# Patient Record
Sex: Female | Born: 1970
Health system: Southern US, Community
[De-identification: ages and names within clinical notes are randomized; demographics above are authoritative.]

## PROBLEM LIST (undated history)

## (undated) DIAGNOSIS — F419 Anxiety disorder, unspecified: Secondary | ICD-10-CM

## (undated) DIAGNOSIS — K635 Polyp of colon: Secondary | ICD-10-CM

## (undated) DIAGNOSIS — E119 Type 2 diabetes mellitus without complications: Secondary | ICD-10-CM

## (undated) DIAGNOSIS — I10 Essential (primary) hypertension: Secondary | ICD-10-CM

## (undated) DIAGNOSIS — N920 Excessive and frequent menstruation with regular cycle: Secondary | ICD-10-CM

## (undated) DIAGNOSIS — E669 Obesity, unspecified: Secondary | ICD-10-CM

## (undated) DIAGNOSIS — K219 Gastro-esophageal reflux disease without esophagitis: Secondary | ICD-10-CM

## (undated) DIAGNOSIS — E785 Hyperlipidemia, unspecified: Secondary | ICD-10-CM

## (undated) DIAGNOSIS — R609 Edema, unspecified: Secondary | ICD-10-CM

## (undated) HISTORY — PX: UPPER GI ENDOSCOPY: SHX6162

## (undated) HISTORY — PX: TOTAL HIP ARTHROPLASTY: SHX124

## (undated) HISTORY — DX: Hyperlipidemia, unspecified: E78.5

## (undated) HISTORY — PX: OTHER SURGICAL HISTORY: SHX169

## (undated) HISTORY — DX: Edema, unspecified: R60.9

## (undated) HISTORY — DX: Essential (primary) hypertension: I10

## (undated) HISTORY — PX: JOINT REPLACEMENT: SHX530

## (undated) HISTORY — DX: Polyp of colon: K63.5

## (undated) HISTORY — DX: Obesity, unspecified: E66.9

## (undated) HISTORY — DX: Type 2 diabetes mellitus without complications: E11.9

## (undated) HISTORY — DX: Anxiety disorder, unspecified: F41.9

## (undated) HISTORY — DX: Gastro-esophageal reflux disease without esophagitis: K21.9

## (undated) HISTORY — DX: Excessive and frequent menstruation with regular cycle: N92.0

---

## 1998-12-29 ENCOUNTER — Other Ambulatory Visit: Admission: RE | Admit: 1998-12-29 | Discharge: 1998-12-29 | Payer: Self-pay | Admitting: Obstetrics and Gynecology

## 1999-01-12 ENCOUNTER — Other Ambulatory Visit: Admission: RE | Admit: 1999-01-12 | Discharge: 1999-01-12 | Payer: Self-pay | Admitting: *Deleted

## 1999-04-18 ENCOUNTER — Other Ambulatory Visit: Admission: RE | Admit: 1999-04-18 | Discharge: 1999-04-18 | Payer: Self-pay | Admitting: *Deleted

## 1999-07-28 ENCOUNTER — Other Ambulatory Visit: Admission: RE | Admit: 1999-07-28 | Discharge: 1999-07-28 | Payer: Self-pay | Admitting: Obstetrics and Gynecology

## 2000-02-21 ENCOUNTER — Other Ambulatory Visit: Admission: RE | Admit: 2000-02-21 | Discharge: 2000-02-21 | Payer: Self-pay | Admitting: Obstetrics and Gynecology

## 2002-11-19 HISTORY — PX: CHOLECYSTECTOMY: SHX55

## 2010-09-25 ENCOUNTER — Ambulatory Visit: Payer: Self-pay | Admitting: Unknown Physician Specialty

## 2010-10-05 ENCOUNTER — Inpatient Hospital Stay: Payer: Self-pay | Admitting: Unknown Physician Specialty

## 2010-10-09 LAB — PATHOLOGY REPORT

## 2011-12-07 HISTORY — PX: OTHER SURGICAL HISTORY: SHX169

## 2012-01-03 ENCOUNTER — Ambulatory Visit: Payer: Self-pay

## 2013-12-31 ENCOUNTER — Ambulatory Visit: Payer: Self-pay | Admitting: Orthopedic Surgery

## 2014-02-10 ENCOUNTER — Ambulatory Visit: Payer: Self-pay | Admitting: Orthopedic Surgery

## 2014-02-10 LAB — CBC
HCT: 43.7 % (ref 35.0–47.0)
HGB: 14.7 g/dL (ref 12.0–16.0)
MCH: 31.3 pg (ref 26.0–34.0)
MCHC: 33.6 g/dL (ref 32.0–36.0)
MCV: 93 fL (ref 80–100)
PLATELETS: 335 10*3/uL (ref 150–440)
RBC: 4.69 10*6/uL (ref 3.80–5.20)
RDW: 13.3 % (ref 11.5–14.5)
WBC: 8.4 10*3/uL (ref 3.6–11.0)

## 2014-02-10 LAB — BASIC METABOLIC PANEL
Anion Gap: 4 — ABNORMAL LOW (ref 7–16)
BUN: 16 mg/dL (ref 7–18)
CO2: 28 mmol/L (ref 21–32)
CREATININE: 0.83 mg/dL (ref 0.60–1.30)
Calcium, Total: 8.3 mg/dL — ABNORMAL LOW (ref 8.5–10.1)
Chloride: 106 mmol/L (ref 98–107)
EGFR (Non-African Amer.): >60
Glucose: 121 mg/dL — ABNORMAL HIGH (ref 65–99)
OSMOLALITY: 278 (ref 275–301)
Potassium: 3.6 mmol/L (ref 3.5–5.1)
Sodium: 138 mmol/L (ref 136–145)

## 2014-02-10 LAB — PROTIME-INR
INR: 1
Prothrombin Time: 13.1 secs (ref 11.5–14.7)

## 2014-02-10 LAB — URINALYSIS, COMPLETE
Bacteria: NONE SEEN
Bilirubin,UR: NEGATIVE
Glucose,UR: NEGATIVE mg/dL (ref 0–75)
Ketone: NEGATIVE
NITRITE: NEGATIVE
Ph: 6 (ref 4.5–8.0)
Protein: NEGATIVE
Specific Gravity: 1.02 (ref 1.003–1.030)
WBC UR: 2 /HPF (ref 0–5)

## 2014-02-10 LAB — APTT: ACTIVATED PTT: 30.7 s (ref 23.6–35.9)

## 2014-02-10 LAB — MRSA PCR SCREENING

## 2014-02-10 LAB — SEDIMENTATION RATE: Erythrocyte Sed Rate: 9 mm/hr (ref 0–20)

## 2014-02-23 ENCOUNTER — Inpatient Hospital Stay: Payer: Self-pay | Admitting: Orthopedic Surgery

## 2014-02-24 LAB — BASIC METABOLIC PANEL
ANION GAP: 3 — AB (ref 7–16)
BUN: 8 mg/dL (ref 7–18)
CREATININE: 0.78 mg/dL (ref 0.60–1.30)
Calcium, Total: 8 mg/dL — ABNORMAL LOW (ref 8.5–10.1)
Chloride: 106 mmol/L (ref 98–107)
Co2: 27 mmol/L (ref 21–32)
Glucose: 152 mg/dL — ABNORMAL HIGH (ref 65–99)
OSMOLALITY: 273 (ref 275–301)
Potassium: 3.8 mmol/L (ref 3.5–5.1)
Sodium: 136 mmol/L (ref 136–145)

## 2014-02-24 LAB — HEMOGLOBIN: HGB: 13.5 g/dL (ref 12.0–16.0)

## 2014-02-24 LAB — PLATELET COUNT: Platelet: 288 10*3/uL (ref 150–440)

## 2014-02-25 LAB — PATHOLOGY REPORT

## 2014-07-18 ENCOUNTER — Emergency Department: Payer: Self-pay | Admitting: Internal Medicine

## 2014-07-18 LAB — URINALYSIS, COMPLETE
Bacteria: NONE SEEN
Bilirubin,UR: NEGATIVE
Glucose,UR: NEGATIVE mg/dL (ref 0–75)
Ketone: NEGATIVE
Leukocyte Esterase: NEGATIVE
Nitrite: NEGATIVE
Ph: 5 (ref 4.5–8.0)
Protein: NEGATIVE
RBC,UR: 3 /HPF (ref 0–5)
SPECIFIC GRAVITY: 1.02 (ref 1.003–1.030)
Squamous Epithelial: 3
WBC UR: 6 /HPF (ref 0–5)

## 2014-07-18 LAB — CBC
HCT: 45.9 % (ref 35.0–47.0)
HGB: 15.1 g/dL (ref 12.0–16.0)
MCH: 30.2 pg (ref 26.0–34.0)
MCHC: 33 g/dL (ref 32.0–36.0)
MCV: 92 fL (ref 80–100)
Platelet: 291 10*3/uL (ref 150–440)
RBC: 5 10*6/uL (ref 3.80–5.20)
RDW: 13.3 % (ref 11.5–14.5)
WBC: 12.2 10*3/uL — ABNORMAL HIGH (ref 3.6–11.0)

## 2014-07-18 LAB — BASIC METABOLIC PANEL
Anion Gap: 9 (ref 7–16)
BUN: 17 mg/dL (ref 7–18)
CHLORIDE: 104 mmol/L (ref 98–107)
CO2: 26 mmol/L (ref 21–32)
CREATININE: 0.88 mg/dL (ref 0.60–1.30)
Calcium, Total: 8.7 mg/dL (ref 8.5–10.1)
EGFR (Non-African Amer.): >60
Glucose: 177 mg/dL — ABNORMAL HIGH (ref 65–99)
Osmolality: 283 (ref 275–301)
Potassium: 3.9 mmol/L (ref 3.5–5.1)
Sodium: 139 mmol/L (ref 136–145)

## 2014-07-18 LAB — PROTIME-INR
INR: 0.9
Prothrombin Time: 12.3 secs (ref 11.5–14.7)

## 2014-07-18 LAB — TSH: THYROID STIMULATING HORM: 1.9 u[IU]/mL

## 2014-07-18 LAB — TROPONIN I
Troponin-I: <0.02 ng/mL
Troponin-I: <0.02 ng/mL

## 2014-07-18 LAB — D-DIMER(ARMC): D-Dimer: 661 ng/ml

## 2014-07-18 LAB — PREGNANCY, URINE: PREGNANCY TEST, URINE: NEGATIVE m[IU]/mL

## 2014-07-18 LAB — PRO B NATRIURETIC PEPTIDE: B-TYPE NATIURETIC PEPTID: 25 pg/mL (ref 0–125)

## 2015-03-12 NOTE — Op Note (Signed)
PATIENT NAME:  Jade Medina, Jade Medina MR#:  [id number] DATE OF BIRTH:  01-26-1971  DATE OF PROCEDURE:  02/23/2014  PREOPERATIVE DIAGNOSIS: Left hip avascular necrosis.   POSTOPERATIVE DIAGNOSIS:  Left hip avascular necrosis.   PROCEDURE: Left total hip replacement, direct anterior approach.   ANESTHESIA: Spinal.   SURGEON: Hessie Knows, M.D.   ASSISTANT:  Rachelle Hora, PA-C.   DESCRIPTION OF PROCEDURE: The patient was brought to the Operating Room and after adequate spinal anesthesia was obtained, the patient was placed on the operative table with the left foot in the Medacta attachment, right foot on a well-padded table. The C-arm was brought in and good visualization of the hip could be obtained. After prepping and draping in the usual sterile fashion, appropriate patient identification and timeout procedures were completed. A direct anterior approach was made to the hip with the incision centered over the greater trochanter and tensor fascia lata muscle. The incision was carried down through the skin and subcutaneous tissue. The tensor muscle was incised and retracted laterally. The deep retractor was placed and the deep fascia incised. Lateral femoral circumflex vessels were quite small and cauterized. The anterior capsule was then exposed and a flap created with the anterior capsule for subsequent retraction. Retractors were placed around the femoral neck and with slight external rotation and traction the femoral neck cut was carried out. Corkscrew was used to remove the head. It was noted to have severe degenerative change with the cartilage peeling off the head with collapsed bone consistent with avascular necrosis. The acetabulum was reamed to 54 mm, at which point there was good bleeding bone. A 54 mm trial fit well and the 54 mm VersaFit cup DM was impacted into place. The leg was externally rotated. Pubofemoral and ischiofemoral ligament release was required to allow for adequate mobilization of  the femur and allow for adequate external rotation. With the leg in extension and external rotation, sequential broaching was carried out to a size 1. This gave good fill to the canal on fluoroscopic views. Trials were placed and the final components chosen. The #1 Amis stem was impacted down the canal with an S28 head and a 54 VersaFit cup DM liner assembled after the canal was placed. The femoral head components were attached and impacted. The hip was then reduced and the length appeared to be appropriate. There was good stability to 90 degrees external rotation test. The hip was thoroughly irrigated and closed with a heavy quill suture for the deep fascia, a subcutaneous drain, 2-0 quill subcutaneously and skin staples. The hip was infiltrated with 30 mL of 0.25% Sensorcaine with epinephrine prior to wound closure to aid in postoperative analgesia. Sterile dressings of Xeroform, 4 x 4's, ABD and tape applied. The patient was sent to the recovery room in stable condition.   ESTIMATED BLOOD LOSS: 350.   COMPLICATIONS: None.   SPECIMENS: Femoral head.   IMPLANTS: Medacta Amis stem size 1 standard with a 54 mm VersaFit cup DM liner and S28 mm head.   ____________________________ Laurene Footman, MD mjm:cs D: 02/23/2014 17:15:13 ET T: 02/23/2014 18:25:28 ET JOB#: [id number]  cc: Laurene Footman, MD, <Dictator> Laurene Footman MD ELECTRONICALLY SIGNED 02/23/2014 23:01

## 2015-03-12 NOTE — Discharge Summary (Signed)
PATIENT NAME:  Jade Medina, Jade Medina MR#:  [id number] DATE OF BIRTH:  06-Sep-1971  DATE OF ADMISSION:  02/23/2014 DATE OF DISCHARGE:  02/26/2014  ADMITTING DIAGNOSIS:  Left hip avascular necrosis.  DISCHARGE DIAGNOSIS:  Left hip avascular necrosis.   OPERATION:  On 02/23/2014, the patient had a left total hip replacement using an anterior approach.   ANESTHESIA:  Spinal.   SURGEON:  Hessie Knows, M.D.   ASSISTANT:  Rachelle Hora, PA-C.   ESTIMATED BLOOD LOSS:  350 mL.   COMPLICATIONS:  None.   IMPLANTS USED:  A Medacta AMIS stem size one standard with a 54 mm Versafit cup DM liner and an S 28 mm head.  The patient was stabilized, brought to the recovery room, and then brought down to the orthopedic floor.   HISTORY OF PRESENT ILLNESS:  The patient is a 44 year old female who presented for an upcoming total hip replacement on the left.  The patient has had avascular necrosis of both hips.  The patient has had a successful right total hip replacement.  The patient has progressive worsening pain over the last year and has had difficulty with walking and activities of daily living.   PHYSICAL EXAMINATION: GENERAL:  Female in no apparent distress with an antalgic gait on the left and a hip lurch that is notable.  LUNGS:  Clear to auscultation.  HEART:  Regular rate and rhythm.  MUSCULOSKELETAL:  In regard to the left hip, the patient has 20 degrees of internal rotation with 30 degrees external rotation and it is quite painful.  There is no hip flexion noted.  The patient has sensations intact.   HOSPITAL COURSE:  After initial admission on 02/23/2014, the patient was brought back to the orthopedic floor.  The patient had a hemoglobin on postoperative day one of 13.5, which remained stable there.  The patient did work with physical therapy, initially bed to chair and progressed up to ambulating 220 feet around the nurse's station.  The patient did the stairs well.  The patient was ready to go home  with home health physical therapy on 02/26/2014.   CONDITION AT DISCHARGE:  Stable.   DISPOSITION:  The patient was sent home with home health physical therapy.   DISCHARGE INSTRUCTIONS: 1.  The patient will follow up with [facility]opedics in about 10 to 14 days for staple removal.  The patient will do weight bear as tolerated on the affected leg.  The patient will raise her leg with 1 to 2 pillows.  The patient will use anterior hip precautions.  The patient will use knee-high TED hose on both legs, removed at bedtime, replace in the morning.  The patient will be encouraged to do cough and deep breathing.  2.  The patient's diet is regular.  3.  The patient is to keep her dressing clean and not to get it wet or dirty.  4.  The patient will work with physical therapy doing gait training, range of motion activities and strengthening at home.  The patient will call the clinic if there is any bright red bleeding, calf pain, bowel or bladder difficulty or any fever greater than 101.5.   DISCHARGE MEDICATIONS:  Resume home medications and then to add Lovenox 40 mg subQ once a day for 14 days and discontinue and begin 81 mg aspirin for about a month as well as oxycodone 5 mg 1 tablet every four hours as needed for severe pain.    ____________________________ Lenna Sciara. Reche Dixon, [place]  jtm:ea D: 02/26/2014 06:14:12 ET T: 02/26/2014 06:37:39 ET JOB#: [id number]  cc: J. Reche Dixon, [place], <Dictator> J Thurza Kwiecinski [facility] PA ELECTRONICALLY SIGNED 03/03/2014 6:38

## 2015-03-12 NOTE — Op Note (Signed)
PATIENT NAME:  Jade Medina, Jade Medina MR#:  [id number] DATE OF BIRTH:  November 05, 1971  DATE OF PROCEDURE:  12/31/2013  PREOPERATIVE DIAGNOSIS: Bilateral carpal tunnel syndrome.   POSTOPERATIVE DIAGNOSIS: Bilateral carpal tunnel syndrome.   PROCEDURE: Bilateral carpal tunnel releases.   ANESTHESIA: General.   SURGEON: Laurene Footman, M.D.   DESCRIPTION OF PROCEDURE: The patient was brought to the operating room and after adequate anesthesia was obtained, both arms were prepped and draped in the usual sterile fashion with a tourniquet applied to the upper arms. After patient identification and timeout procedures were completed, the right arm was addressed first. The tourniquet was raised. An approximately 1 inch incision was made in line with the ring metacarpal going from the distal wrist flexion crease distally. The skin and subcutaneous tissue were divided. The transverse carpal ligament identified and incised distally. A vascular hemostat was placed underneath it to protect the underlying structures, and release was carried out proximally. In the proximal portion of the canal, there was approximately 1 cm area of compression on the median nerve. After release, there was good vascular blush. There were no masses within the canal. Mild flexor tenosynovitis. The wound was irrigated and 10 mL of 0.5% Sensorcaine infiltrated. The wound was closed with simple interrupted 4-0 nylon skin sutures. Xeroform, 4 x 4's, Webril and Ace wrap applied. The tourniquet was let down. Going to the left arm, identical procedure carried out, again with significant compression in the proximal portion as well as some hourglass constriction present about 0.5 cm to 1 cm in length. Again, good vascular blush and mild flexor tenosynovitis. After release, the this hand was also infiltrated with 0.5% Sensorcaine 10 mL, closed with simple interrupted 4-0 nylon. Xeroform, 4 x 4's, Webril and Ace wrap applied. Tourniquet time on the left was 9  minutes, on the right 10 minutes.   COMPLICATIONS: None.   SPECIMEN: None.   CONDITION: To recovery room stable.   ____________________________ Laurene Footman, MD mjm:gb D: 12/31/2013 19:54:20 ET T: 01/01/2014 00:26:49 ET JOB#: [id number]  cc: Laurene Footman, MD, <Dictator> Laurene Footman MD ELECTRONICALLY SIGNED 01/01/2014 12:45

## 2015-12-05 LAB — HM MAMMOGRAPHY

## 2016-08-19 HISTORY — PX: LAPAROSCOPIC GASTRIC BAND REMOVAL WITH LAPAROSCOPIC GASTRIC SLEEVE RESECTION: SHX6498

## 2016-09-06 DIAGNOSIS — Z9884 Bariatric surgery status: Secondary | ICD-10-CM | POA: Insufficient documentation

## 2016-12-11 LAB — HM PAP SMEAR: HM Pap smear: NEGATIVE

## 2016-12-24 DIAGNOSIS — S8002XA Contusion of left knee, initial encounter: Secondary | ICD-10-CM | POA: Insufficient documentation

## 2017-02-06 ENCOUNTER — Ambulatory Visit (INDEPENDENT_AMBULATORY_CARE_PROVIDER_SITE_OTHER): Payer: 59 | Admitting: Obstetrics and Gynecology

## 2017-02-06 ENCOUNTER — Encounter: Payer: Self-pay | Admitting: Obstetrics and Gynecology

## 2017-02-06 VITALS — BP 110/70 | HR 66 | Ht 63.0 in | Wt 198.0 lb

## 2017-02-06 DIAGNOSIS — Z30430 Encounter for insertion of intrauterine contraceptive device: Secondary | ICD-10-CM | POA: Diagnosis not present

## 2017-02-06 MED ORDER — LEVONORGESTREL 20 MCG/24HR IU IUD: 1.0000 | INTRAUTERINE_SYSTEM | Freq: Once | INTRAUTERINE | 0 refills | Status: AC

## 2017-02-06 NOTE — Progress Notes (Signed)
    IUD PROCEDURE NOTE:  Jade Medina is a 46 y.o. No obstetric history on file. here for Mirena  IUD insertion. No GYN concerns.  Last pap smear was normal. She would like IUD for cycle control--menorrhagia. Had Mirena in past with amenorrhea. S/P recent gastric surg with wt loss. Husband is s/p vasectomy.  BP 110/70   Pulse 66   Ht 5\' 3"  (1.6 m)   Wt 198 lb (89.8 kg)   LMP 01/31/2017   BMI 35.07 kg/m   IUD Insertion Procedure Note Patient identified, informed consent performed, consent signed.   Discussed risks of irregular bleeding, cramping, infection, malpositioning or misplacement of the IUD outside the uterus which may require further procedure such as laparoscopy, risk of failure <1%. Time out was performed.   A bimanual exam showed the uterus to be anteverted.  Speculum placed in the vagina.  Cervix visualized.  Cleaned with Betadine x 2.  Grasped anteriorly with a single tooth tenaculum.  Uterus sounded to 9.0 cm.   IUD placed per manufacturer's recommendations.  Strings trimmed to 3 cm. Tenaculum was removed, good hemostasis noted.  Patient tolerated procedure well.   ASSESSMENT: IUD insertion Mirena  LOT [place], exp Sep 2020  Plan:  Patient was given post-procedure instructions.  She was advised to have backup contraception for one week.   Call if you are having increasing pain, cramps or bleeding or if you have a fever greater than 100.4 degrees F., shaking chills, nausea or vomiting. Patient was also asked to check IUD strings periodically and follow up in 4 weeks for IUD check.  Return in about 4 weeks (around 03/06/2017) for IUD string chk.  Alicia B. Copland, PA-C 02/06/2017 11:36 AM

## 2017-03-06 ENCOUNTER — Ambulatory Visit: Payer: 59 | Admitting: Obstetrics and Gynecology

## 2017-04-08 ENCOUNTER — Telehealth: Payer: Self-pay

## 2017-04-08 NOTE — Telephone Encounter (Signed)
Most likely normal, but check u/s for placement/uterus eval. Can be sched next wk at [facility]. If neg, normal sx with IUD. RN to discuss with pt.

## 2017-04-08 NOTE — Telephone Encounter (Signed)
Pt aware.  Tx'd to SP to sched.

## 2017-04-08 NOTE — Telephone Encounter (Signed)
Pt calling.  Has period c IUD every other week and lasts 10+ days.  What to do ?  [phone]

## 2017-04-18 ENCOUNTER — Other Ambulatory Visit: Payer: Self-pay | Admitting: Obstetrics and Gynecology

## 2017-04-18 DIAGNOSIS — Z30431 Encounter for routine checking of intrauterine contraceptive device: Secondary | ICD-10-CM

## 2017-04-19 ENCOUNTER — Other Ambulatory Visit: Payer: Self-pay | Admitting: Obstetrics and Gynecology

## 2017-04-19 ENCOUNTER — Ambulatory Visit: Payer: 59

## 2017-04-19 ENCOUNTER — Encounter: Payer: Self-pay | Admitting: Obstetrics and Gynecology

## 2017-04-19 ENCOUNTER — Ambulatory Visit (INDEPENDENT_AMBULATORY_CARE_PROVIDER_SITE_OTHER): Payer: 59 | Admitting: Obstetrics and Gynecology

## 2017-04-19 VITALS — BP 112/72 | HR 78 | Ht 63.0 in | Wt 192.0 lb

## 2017-04-19 DIAGNOSIS — N938 Other specified abnormal uterine and vaginal bleeding: Secondary | ICD-10-CM

## 2017-04-19 DIAGNOSIS — Z30431 Encounter for routine checking of intrauterine contraceptive device: Secondary | ICD-10-CM

## 2017-04-19 NOTE — Progress Notes (Signed)
Chief Complaint  Patient presents with  . Follow-up    f/u GYN u/s for IUD placement     HPI:      [name] is a 46 y.o. [id number] who LMP was Patient's last menstrual period was 04/05/2017 (exact date)., presents today for IUD f/u. She had Mirena replaced 3/18. Since then, she has had almost daily bleeding/spotting, sometimes with large clots. She has had a few days without bleeding. She denies any cramping/pelvic pain. She has a hx of menorrhagia with the IUD after her bariatric surg 10/17. IUD was due for removal and replacement 3/18. Pt had been amenorrheic with IUD prior to wt loss. She never RTO for 4 wk IUD check after placement.    There are no active problems to display for this patient.   Family History  Problem Relation Age of Onset  . Stroke Father     Social History   Social History  . Marital status: Married    Spouse name: N/A  . Number of children: N/A  . Years of education: N/A   Occupational History  . Not on file.   Social History Main Topics  . Smoking status: Never Smoker  . Smokeless tobacco: Never Used  . Alcohol use No  . Drug use: No  . Sexual activity: Yes    Partners: Male    Birth control/ protection: Surgical   Other Topics Concern  . Not on file   Social History Narrative  . No narrative on file     Current Outpatient Prescriptions:  .  omeprazole (PRILOSEC) 40 MG capsule, Take 40 mg by mouth daily., Disp: , Rfl:  .  levonorgestrel (MIRENA, 52 MG,) 20 MCG/24HR IUD, 1 Intra Uterine Device (1 each total) by Intrauterine route once., Disp: 1 Intra Uterine Device, Rfl: 0  Review of Systems  Constitutional: Negative for fever.  Gastrointestinal: Negative for blood in stool, constipation, diarrhea, nausea and vomiting.  Genitourinary: Positive for menstrual problem and vaginal bleeding. Negative for dyspareunia, dysuria, flank pain, frequency, hematuria, urgency, vaginal discharge and vaginal pain.  Musculoskeletal: Negative  for back pain.  Skin: Negative for rash.     OBJECTIVE:   Vitals:  BP 112/72   Pulse 78   Ht 5\' 3"  (1.6 m)   Wt 192 lb (87.1 kg)   LMP 04/05/2017 (Exact Date)   BMI 34.01 kg/m   Physical Exam  Constitutional: She is oriented to person, place, and time and well-developed, well-nourished, and in no distress. Vital signs are normal.  Genitourinary: Vagina normal, uterus normal, cervix normal, right adnexa normal, left adnexa normal and vulva normal. Uterus is not enlarged. Cervix exhibits no motion tenderness and no tenderness. Right adnexum displays no mass and no tenderness. Left adnexum displays no mass and no tenderness. Vulva exhibits no erythema, no exudate, no lesion, no rash and no tenderness. Vagina exhibits no lesion.  Genitourinary Comments: IUD STRINGS IN PLACE  Neurological: She is oriented to person, place, and time.  Vitals reviewed.   Results: GYN u/s-->EM=4.62mm; IUD in proper location; bilat ovar with follicles; no FF in CDS  Assessment/Plan: Encounter for routine checking of intrauterine contraceptive device (IUD) - IUD in place, EM=4.33 mm. Reassurance.  DUB (dysfunctional uterine bleeding) - Reassurance. Menses can be irreg first 3-6 months of IUD, although pt also had it at the end of last IUD. Follow sx a few more months.     Return if symptoms worsen or fail to improve.  Sangita Zani B. Kalee Broxton,  PA-C 04/19/2017 12:08 PM

## 2017-12-18 ENCOUNTER — Encounter: Payer: Self-pay | Admitting: Obstetrics and Gynecology

## 2017-12-18 ENCOUNTER — Ambulatory Visit (INDEPENDENT_AMBULATORY_CARE_PROVIDER_SITE_OTHER): Payer: 59 | Admitting: Obstetrics and Gynecology

## 2017-12-18 VITALS — BP 104/62 | HR 75 | Ht 63.0 in | Wt 184.0 lb

## 2017-12-18 DIAGNOSIS — Z13220 Encounter for screening for lipoid disorders: Secondary | ICD-10-CM

## 2017-12-18 DIAGNOSIS — Z30431 Encounter for routine checking of intrauterine contraceptive device: Secondary | ICD-10-CM | POA: Diagnosis not present

## 2017-12-18 DIAGNOSIS — Z0000 Encounter for general adult medical examination without abnormal findings: Secondary | ICD-10-CM | POA: Diagnosis not present

## 2017-12-18 DIAGNOSIS — Z01419 Encounter for gynecological examination (general) (routine) without abnormal findings: Secondary | ICD-10-CM | POA: Diagnosis not present

## 2017-12-18 DIAGNOSIS — F419 Anxiety disorder, unspecified: Secondary | ICD-10-CM | POA: Diagnosis not present

## 2017-12-18 DIAGNOSIS — Z131 Encounter for screening for diabetes mellitus: Secondary | ICD-10-CM

## 2017-12-18 DIAGNOSIS — Z1321 Encounter for screening for nutritional disorder: Secondary | ICD-10-CM | POA: Diagnosis not present

## 2017-12-18 DIAGNOSIS — Z1329 Encounter for screening for other suspected endocrine disorder: Secondary | ICD-10-CM | POA: Diagnosis not present

## 2017-12-18 DIAGNOSIS — Z130 Encounter for screening for diseases of the blood and blood-forming organs and certain disorders involving the immune mechanism: Secondary | ICD-10-CM | POA: Diagnosis not present

## 2017-12-18 DIAGNOSIS — Z1231 Encounter for screening mammogram for malignant neoplasm of breast: Secondary | ICD-10-CM

## 2017-12-18 DIAGNOSIS — Z1239 Encounter for other screening for malignant neoplasm of breast: Secondary | ICD-10-CM

## 2017-12-18 MED ORDER — SERTRALINE HCL 50 MG PO TABS: 50.0000 mg | ORAL_TABLET | Freq: Every day | ORAL | 1 refills | Status: DC

## 2017-12-18 NOTE — Progress Notes (Signed)
PCP:  Patient, No Pcp Per   Chief Complaint  Patient presents with  . Gynecologic Exam     HPI:      Jade Medina is a 47 y.o. [id number] who LMP was No LMP recorded. Patient is not currently having periods (Reason: IUD)., presents today for her annual examination.  Her menses are absent with IUD, occas light spotting for a few days only. Dysmenorrhea none.  Mirena replaced 02/06/17 for menorrhagia.   Sex activity: single partner, contraception - vasectomy.  Last Pap: December 11, 2016  Results were: no abnormalities /neg HPV DNA  Hx of STDs: none  Last mammogram: December 05, 2015  Results were: normal--routine follow-up in 12 months There is no FH of breast cancer. There is no FH of ovarian cancer. The patient does do self-breast exams.  Tobacco use: The patient denies current or previous tobacco use. Alcohol use: none No drug use.  Exercise: not active  She does get adequate calcium and Vitamin D in her diet. S/P bariatric surg. Due for yearly fasting labs.   Pt weaned off paxil last yr for anxiety but feels she needs to go back on it. 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 Would like to try zoloft. Concerned about paxil and wt gain.    Past Medical History:  Diagnosis Date  . Anxiety   . Diabetes type 2, controlled ([place])   . Edema   . GERD (gastroesophageal reflux disease)   . Hyperlipidemia   . Menorrhagia   . Obesity     Past Surgical History:  Procedure Laterality Date  . CESAREAN SECTION  1995  . CHOLECYSTECTOMY  2004  . IUD placement  12/07/2011  . LAPAROSCOPIC GASTRIC BAND REMOVAL WITH LAPAROSCOPIC GASTRIC SLEEVE RESECTION  08/2016  . TOTAL HIP ARTHROPLASTY  2011, 2015  . weightloss      Family History  Problem Relation Age of Onset  . Stroke Father   . Hypertension Father     Social History   Socioeconomic History  . Marital status: Married    Spouse name: Not on file  . Number of children: Not on file  . Years of  education: Not on file  . Highest education level: Not on file  Social Needs  . Financial resource strain: Not on file  . Food insecurity - worry: Not on file  . Food insecurity - inability: Not on file  . Transportation needs - medical: Not on file  . Transportation needs - non-medical: Not on file  Occupational History  . Not on file  Tobacco Use  . Smoking status: Never Smoker  . Smokeless tobacco: Never Used  Substance and Sexual Activity  . Alcohol use: No  . Drug use: No  . Sexual activity: Yes    Partners: Male    Birth control/protection: Surgical  Other Topics Concern  . Not on file  Social History Narrative  . Not on file    Current Meds  Medication Sig  . omeprazole (PRILOSEC) 40 MG capsule Take 40 mg by mouth daily.     ROS:  Review of Systems  Constitutional: Negative for fatigue, fever and unexpected weight change.  Respiratory: Negative for cough, shortness of breath and wheezing.   Cardiovascular: Negative for chest pain, palpitations and leg swelling.  Gastrointestinal: Negative for blood in stool, constipation, diarrhea, nausea and vomiting.  Endocrine: Negative for cold intolerance, heat intolerance and polyuria.  Genitourinary: Negative for dyspareunia, dysuria, flank pain,  frequency, genital sores, hematuria, menstrual problem, pelvic pain, urgency, vaginal bleeding, vaginal discharge and vaginal pain.  Musculoskeletal: Negative for back pain, joint swelling and myalgias.  Skin: Negative for rash.  Neurological: Negative for dizziness, syncope, light-headedness, numbness and headaches.  Hematological: Negative for adenopathy.  Psychiatric/Behavioral: Negative for agitation, confusion, sleep disturbance and suicidal ideas. The patient is not nervous/anxious.      Objective: BP 104/62 (BP Location: Left Arm, Patient Position: Sitting, Cuff Size: Normal)   Pulse 75   Ht 5\' 3"  (1.6 m)   Wt 184 lb (83.5 kg)   BMI 32.59 kg/m    Physical Exam    Constitutional: She is oriented to person, place, and time. She appears well-developed and well-nourished.  Genitourinary: Vagina normal and uterus normal. There is no rash or tenderness on the right labia. There is no rash or tenderness on the left labia. No erythema or tenderness in the vagina. No vaginal discharge found. Right adnexum does not display mass and does not display tenderness. Left adnexum does not display mass and does not display tenderness.  Cervix exhibits visible IUD strings. Cervix does not exhibit motion tenderness or polyp. Uterus is not enlarged or tender.  Neck: Normal range of motion. No thyromegaly present.  Cardiovascular: Normal rate, regular rhythm and normal heart sounds.  No murmur heard. Pulmonary/Chest: Effort normal and breath sounds normal. Right breast exhibits no mass, no nipple discharge, no skin change and no tenderness. Left breast exhibits no mass, no nipple discharge, no skin change and no tenderness.  Abdominal: Soft. There is no tenderness. There is no guarding.  Musculoskeletal: Normal range of motion.  Neurological: She is alert and oriented to person, place, and time. No cranial nerve deficit.  Psychiatric: She has a normal mood and affect. Her behavior is normal.  Vitals reviewed.   Assessment/Plan: Encounter for annual routine gynecological examination  Screening for breast cancer - Pt to sched mammo. - Plan: MM DIGITAL SCREENING BILATERAL  Encounter for routine checking of intrauterine contraceptive device (IUD) - IUD in place. Due for rem 02/06/22  Anxiety - Restart SSRI but wants to try zoloft. Rx zoloft. F/u in 2 mos with sx. - Plan: sertraline (ZOLOFT) 50 MG tablet  Blood tests for routine general physical examination - Due to s/p bariatric surg - Plan: Comprehensive metabolic panel, Lipid panel, Hemoglobin A1c, VITAMIN D 25 Hydroxy (Vit-D Deficiency, Fractures), TSH + free T4, B12 and Folate Panel  Screening cholesterol level - Plan:  Lipid panel  Encounter for vitamin deficiency screening - Plan: VITAMIN D 25 Hydroxy (Vit-D Deficiency, Fractures)  Thyroid disorder screening - Plan: TSH + free T4  Screening for diabetes mellitus - Plan: Hemoglobin A1c  Screening for deficiency anemia - Plan: B12 and Folate Panel  Meds ordered this encounter  Medications  . sertraline (ZOLOFT) 50 MG tablet    Sig: Take 1 tablet (50 mg total) by mouth daily. Take 1/2 tab daily for 6 days, then 1 tab daily    Dispense:  30 tablet    Refill:  1    Order Specific Question:   Supervising Provider    Answer:   Gae Dry [[id number]]             GYN counsel breast self exam, mammography screening, adequate intake of calcium and vitamin D, diet and exercise     F/U  Return in about 1 year (around 12/18/2018).  Vivaan Helseth B. Lilienne Weins, PA-C 12/18/2017 3:43 PM

## 2017-12-18 NOTE — Patient Instructions (Addendum)
I value your feedback and entrusting us with your care. If you get a [facility] patient survey, I would appreciate you taking the time to let us know about your experience today. Thank you!  [facility]: [phone]  [facility]: [phone]  

## 2017-12-19 ENCOUNTER — Other Ambulatory Visit: Payer: 59

## 2017-12-19 DIAGNOSIS — Z13220 Encounter for screening for lipoid disorders: Secondary | ICD-10-CM | POA: Diagnosis not present

## 2017-12-19 DIAGNOSIS — Z130 Encounter for screening for diseases of the blood and blood-forming organs and certain disorders involving the immune mechanism: Secondary | ICD-10-CM | POA: Diagnosis not present

## 2017-12-19 DIAGNOSIS — Z1329 Encounter for screening for other suspected endocrine disorder: Secondary | ICD-10-CM | POA: Diagnosis not present

## 2017-12-19 DIAGNOSIS — Z1321 Encounter for screening for nutritional disorder: Secondary | ICD-10-CM

## 2017-12-19 DIAGNOSIS — Z0000 Encounter for general adult medical examination without abnormal findings: Secondary | ICD-10-CM | POA: Diagnosis not present

## 2017-12-19 DIAGNOSIS — Z131 Encounter for screening for diabetes mellitus: Secondary | ICD-10-CM

## 2017-12-20 LAB — COMPREHENSIVE METABOLIC PANEL
A/G RATIO: 1.6 (ref 1.2–2.2)
ALT: 11 IU/L (ref 0–32)
AST: 16 IU/L (ref 0–40)
Albumin: 4.2 g/dL (ref 3.5–5.5)
Alkaline Phosphatase: 50 IU/L (ref 39–117)
BUN/Creatinine Ratio: 17 (ref 9–23)
BUN: 12 mg/dL (ref 6–24)
Bilirubin Total: 0.7 mg/dL (ref 0.0–1.2)
CALCIUM: 9.1 mg/dL (ref 8.7–10.2)
CO2: 23 mmol/L (ref 20–29)
Chloride: 103 mmol/L (ref 96–106)
Creatinine, Ser: 0.72 mg/dL (ref 0.57–1.00)
GFR calc Af Amer: 117 mL/min/{1.73_m2} (ref >59)
GFR, EST NON AFRICAN AMERICAN: 101 mL/min/{1.73_m2} (ref >59)
GLUCOSE: 82 mg/dL (ref 65–99)
Globulin, Total: 2.6 g/dL (ref 1.5–4.5)
POTASSIUM: 4.8 mmol/L (ref 3.5–5.2)
Sodium: 140 mmol/L (ref 134–144)
Total Protein: 6.8 g/dL (ref 6.0–8.5)

## 2017-12-20 LAB — LIPID PANEL
CHOL/HDL RATIO: 3.6 ratio (ref 0.0–4.4)
Cholesterol, Total: 181 mg/dL (ref 100–199)
HDL: 50 mg/dL (ref >39)
LDL Calculated: 118 mg/dL — ABNORMAL HIGH (ref 0–99)
TRIGLYCERIDES: 67 mg/dL (ref 0–149)
VLDL Cholesterol Cal: 13 mg/dL (ref 5–40)

## 2017-12-20 LAB — B12 AND FOLATE PANEL
Folate: >20 ng/mL (ref >3.0)
Vitamin B-12: 1121 pg/mL (ref 232–1245)

## 2017-12-20 LAB — TSH+FREE T4
Free T4: 1.28 ng/dL (ref 0.82–1.77)
TSH: 2.51 u[IU]/mL (ref 0.450–4.500)

## 2017-12-20 LAB — HEMOGLOBIN A1C
ESTIMATED AVERAGE GLUCOSE: 108 mg/dL
HEMOGLOBIN A1C: 5.4 % (ref 4.8–5.6)

## 2017-12-20 LAB — VITAMIN D 25 HYDROXY (VIT D DEFICIENCY, FRACTURES): Vit D, 25-Hydroxy: 31.8 ng/mL (ref 30.0–100.0)

## 2018-01-03 ENCOUNTER — Ambulatory Visit
Admission: RE | Admit: 2018-01-03 | Discharge: 2018-01-03 | Disposition: A | Discharge status: Home / Self Care | Payer: 59 | Source: Ambulatory Visit | Attending: Obstetrics and Gynecology | Admitting: Obstetrics and Gynecology

## 2018-01-03 DIAGNOSIS — Z1231 Encounter for screening mammogram for malignant neoplasm of breast: Secondary | ICD-10-CM | POA: Diagnosis not present

## 2018-01-03 DIAGNOSIS — Z1239 Encounter for other screening for malignant neoplasm of breast: Secondary | ICD-10-CM

## 2018-01-06 ENCOUNTER — Encounter: Payer: Self-pay | Admitting: Obstetrics and Gynecology

## 2018-02-05 ENCOUNTER — Other Ambulatory Visit: Payer: Self-pay | Admitting: Obstetrics and Gynecology

## 2018-02-05 ENCOUNTER — Encounter: Payer: Self-pay | Admitting: Obstetrics and Gynecology

## 2018-02-05 DIAGNOSIS — F419 Anxiety disorder, unspecified: Secondary | ICD-10-CM

## 2018-02-05 DIAGNOSIS — K219 Gastro-esophageal reflux disease without esophagitis: Secondary | ICD-10-CM

## 2018-02-05 MED ORDER — SERTRALINE HCL 50 MG PO TABS: 50.0000 mg | ORAL_TABLET | Freq: Every day | ORAL | 3 refills | Status: DC

## 2018-02-05 MED ORDER — OMEPRAZOLE 40 MG PO CPDR: 40.0000 mg | DELAYED_RELEASE_CAPSULE | Freq: Every day | ORAL | 3 refills | Status: DC

## 2018-02-05 NOTE — Progress Notes (Signed)
Doing well on zoloft. Rx for yr. Rx RF on omeprazole as well. F/u prn.

## 2018-02-10 ENCOUNTER — Other Ambulatory Visit: Payer: Self-pay | Admitting: Obstetrics and Gynecology

## 2018-02-10 ENCOUNTER — Encounter: Payer: Self-pay | Admitting: Obstetrics and Gynecology

## 2018-02-10 MED ORDER — PANTOPRAZOLE SODIUM 40 MG PO TBEC: 40.0000 mg | DELAYED_RELEASE_TABLET | Freq: Every day | ORAL | 3 refills | Status: DC

## 2018-02-10 NOTE — Progress Notes (Signed)
Rx change from omeprazole to pantoprazole due to ins coverage.

## 2018-02-17 ENCOUNTER — Other Ambulatory Visit: Payer: Self-pay | Admitting: Obstetrics and Gynecology

## 2018-02-17 DIAGNOSIS — F419 Anxiety disorder, unspecified: Secondary | ICD-10-CM

## 2018-02-18 ENCOUNTER — Other Ambulatory Visit: Payer: Self-pay | Admitting: Obstetrics and Gynecology

## 2018-02-18 ENCOUNTER — Encounter: Payer: Self-pay | Admitting: Obstetrics and Gynecology

## 2018-02-18 DIAGNOSIS — F419 Anxiety disorder, unspecified: Secondary | ICD-10-CM

## 2018-02-18 MED ORDER — PANTOPRAZOLE SODIUM 40 MG PO TBEC: 40.0000 mg | DELAYED_RELEASE_TABLET | Freq: Every day | ORAL | 3 refills | Status: DC

## 2018-02-18 MED ORDER — SERTRALINE HCL 50 MG PO TABS: 50.0000 mg | ORAL_TABLET | Freq: Every day | ORAL | 3 refills | Status: DC

## 2018-02-18 NOTE — Telephone Encounter (Signed)
Please advise if pt needs to be seen first?

## 2018-02-18 NOTE — Progress Notes (Unsigned)
Rx RF to [facility], not [facility].

## 2018-02-18 NOTE — Progress Notes (Signed)
Rx RF for 1 yr. Sent to wrong pharm 02/05/18

## 2018-06-03 DIAGNOSIS — J0100 Acute maxillary sinusitis, unspecified: Secondary | ICD-10-CM | POA: Diagnosis not present

## 2018-09-27 IMAGING — MG MM DIGITAL SCREENING BILAT W/ CAD
5 series · 5 of 5 positions shown · non-contrast
Comparison: Previous exam(s).

CLINICAL DATA: Screening.

EXAM:
DIGITAL SCREENING BILATERAL MAMMOGRAM WITH CAD

[L MLO]
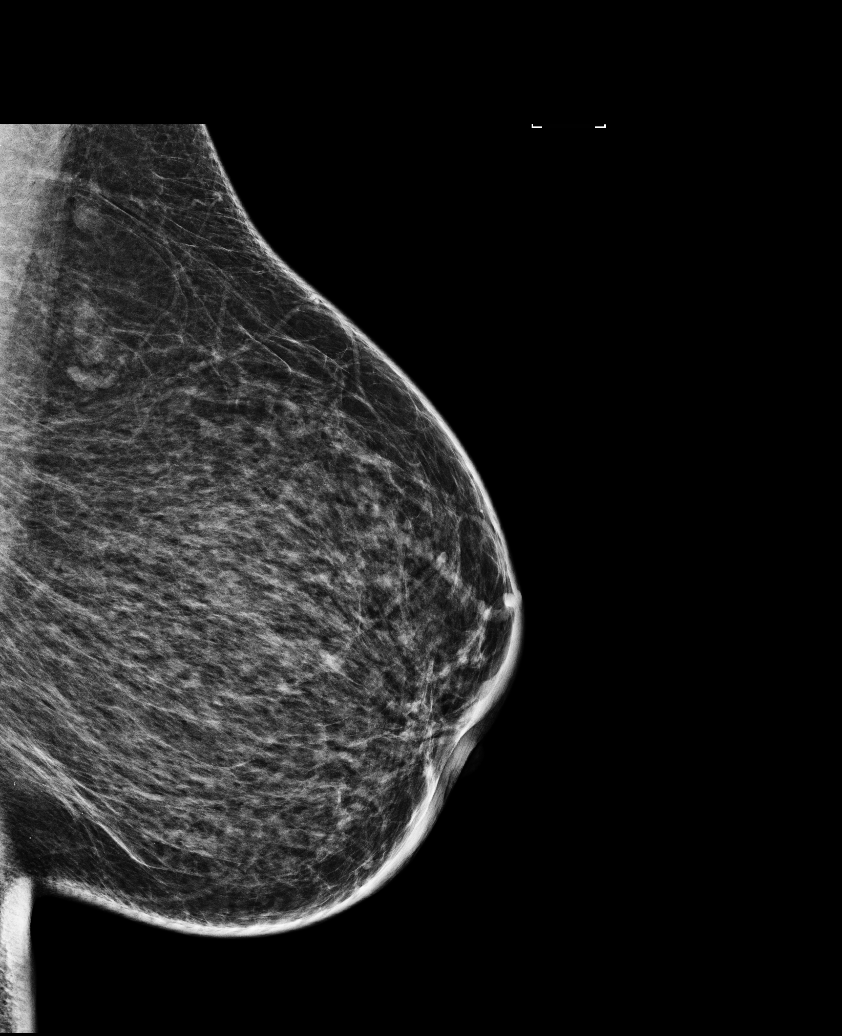

[R CC]
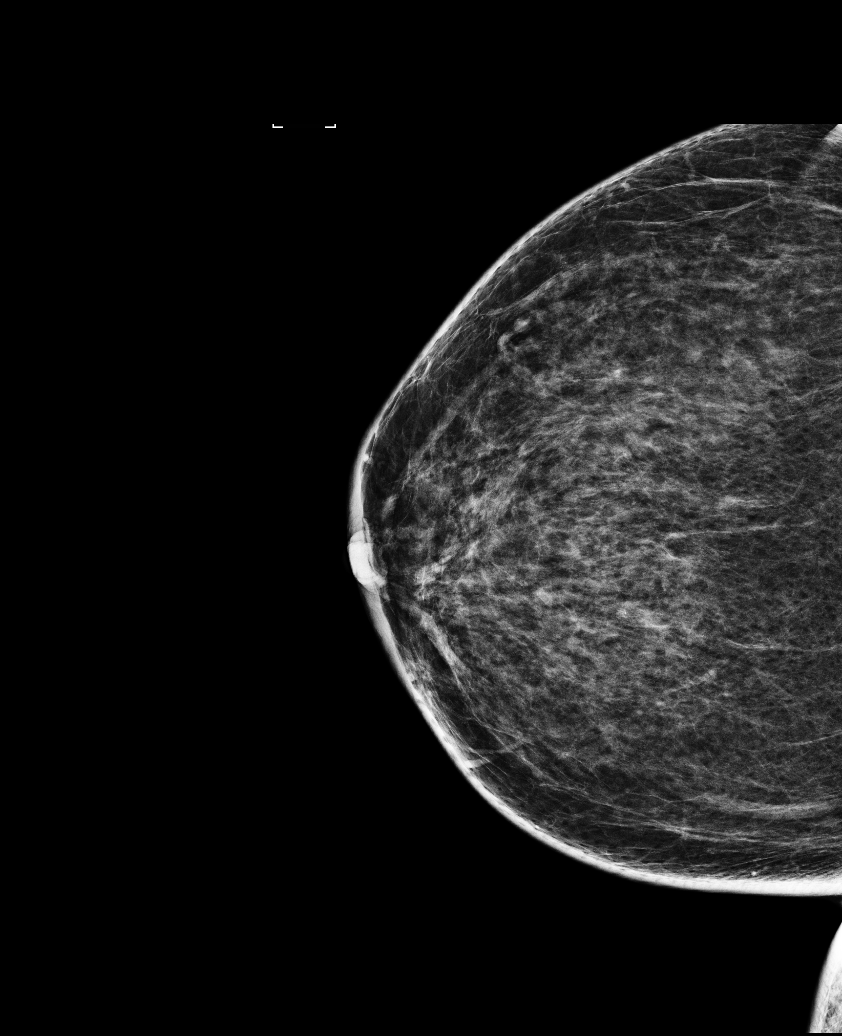

[L CC]
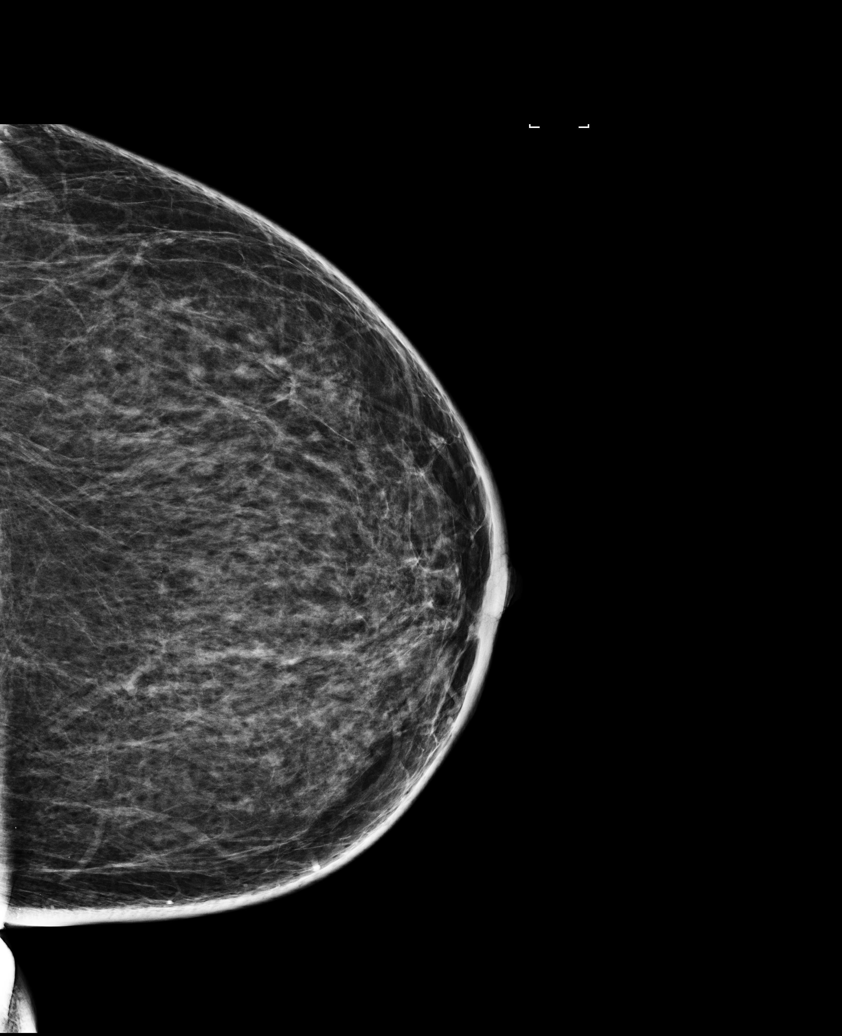

[R MLO (1 of 2)]
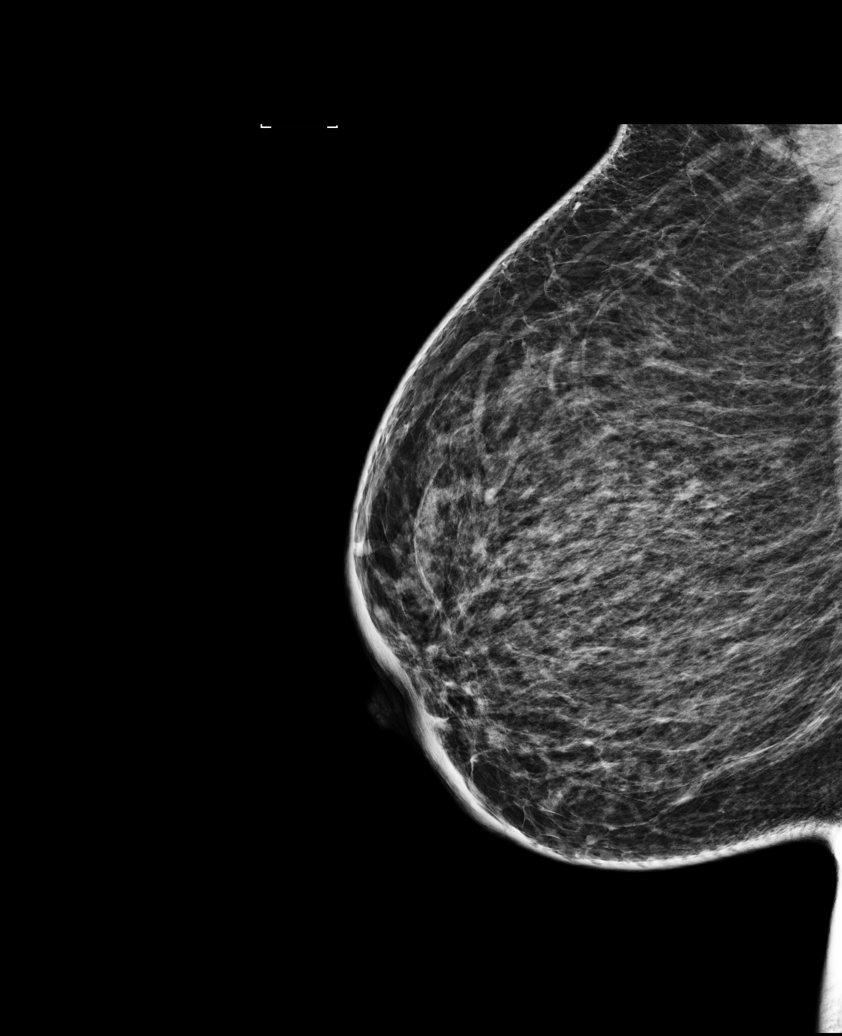

[R MLO (2 of 2)]
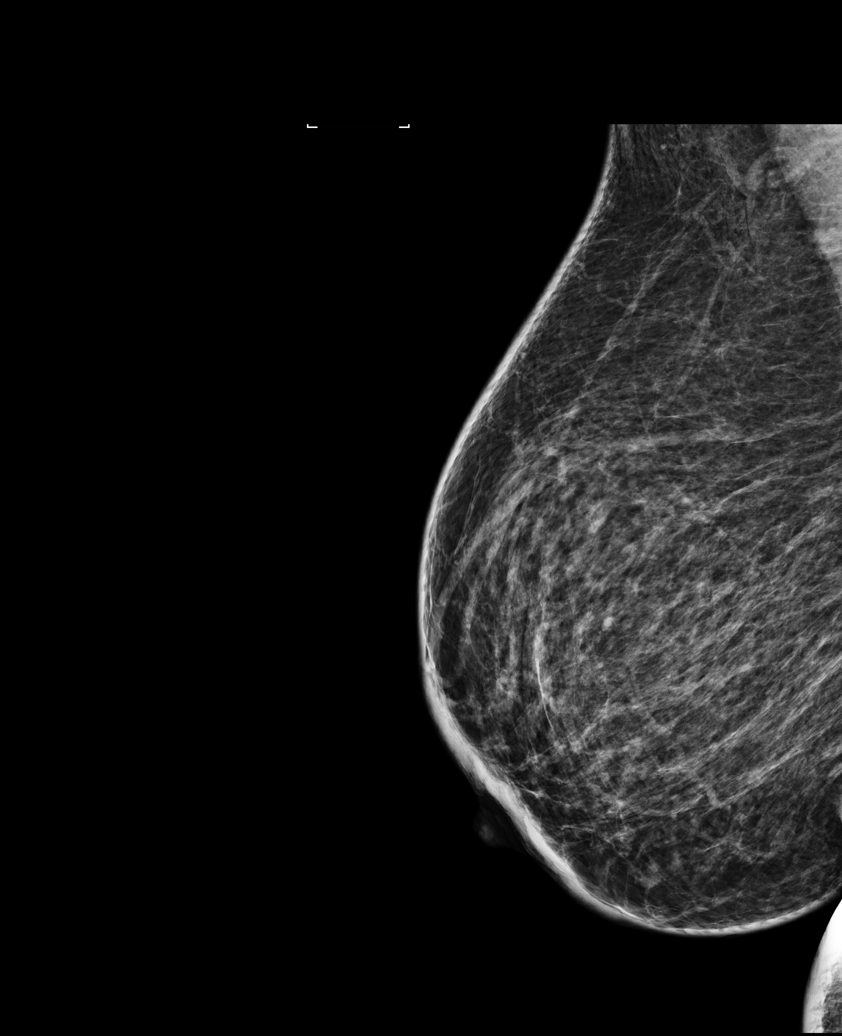

[5 of 5 positions shown; findings below may reference images not displayed]

ACR Breast Density Category b: There are scattered areas of
fibroglandular density.
FINDINGS: There are no findings suspicious for malignancy. Images were
processed with CAD.
IMPRESSION: No mammographic evidence of malignancy. A result letter of this
screening mammogram will be mailed directly to the patient.

RECOMMENDATION:
Screening mammogram in one year. (Code:AS-G-LCT)

BI-RADS CATEGORY  1: Negative.

## 2018-11-09 DIAGNOSIS — J0101 Acute recurrent maxillary sinusitis: Secondary | ICD-10-CM | POA: Diagnosis not present

## 2019-02-09 ENCOUNTER — Other Ambulatory Visit: Payer: Self-pay | Admitting: Obstetrics and Gynecology

## 2019-02-09 DIAGNOSIS — F419 Anxiety disorder, unspecified: Secondary | ICD-10-CM

## 2019-02-09 NOTE — Telephone Encounter (Signed)
Please advise 

## 2019-03-10 DIAGNOSIS — M5412 Radiculopathy, cervical region: Secondary | ICD-10-CM | POA: Insufficient documentation

## 2019-03-31 DIAGNOSIS — M5412 Radiculopathy, cervical region: Secondary | ICD-10-CM | POA: Diagnosis not present

## 2019-03-31 DIAGNOSIS — M9901 Segmental and somatic dysfunction of cervical region: Secondary | ICD-10-CM | POA: Diagnosis not present

## 2019-03-31 DIAGNOSIS — M62830 Muscle spasm of back: Secondary | ICD-10-CM | POA: Diagnosis not present

## 2019-04-02 DIAGNOSIS — M9901 Segmental and somatic dysfunction of cervical region: Secondary | ICD-10-CM | POA: Diagnosis not present

## 2019-04-02 DIAGNOSIS — M5412 Radiculopathy, cervical region: Secondary | ICD-10-CM | POA: Diagnosis not present

## 2019-04-02 DIAGNOSIS — M62830 Muscle spasm of back: Secondary | ICD-10-CM | POA: Diagnosis not present

## 2019-04-07 DIAGNOSIS — M62830 Muscle spasm of back: Secondary | ICD-10-CM | POA: Diagnosis not present

## 2019-04-07 DIAGNOSIS — M9901 Segmental and somatic dysfunction of cervical region: Secondary | ICD-10-CM | POA: Diagnosis not present

## 2019-04-07 DIAGNOSIS — M5412 Radiculopathy, cervical region: Secondary | ICD-10-CM | POA: Diagnosis not present

## 2019-04-09 DIAGNOSIS — M62830 Muscle spasm of back: Secondary | ICD-10-CM | POA: Diagnosis not present

## 2019-04-09 DIAGNOSIS — M9901 Segmental and somatic dysfunction of cervical region: Secondary | ICD-10-CM | POA: Diagnosis not present

## 2019-04-09 DIAGNOSIS — M5412 Radiculopathy, cervical region: Secondary | ICD-10-CM | POA: Diagnosis not present

## 2019-04-17 ENCOUNTER — Other Ambulatory Visit: Payer: Self-pay | Admitting: Obstetrics and Gynecology

## 2019-04-17 DIAGNOSIS — F419 Anxiety disorder, unspecified: Secondary | ICD-10-CM

## 2019-04-17 NOTE — Telephone Encounter (Signed)
Please advise 

## 2019-06-08 DIAGNOSIS — M542 Cervicalgia: Secondary | ICD-10-CM | POA: Insufficient documentation

## 2019-08-04 ENCOUNTER — Ambulatory Visit: Payer: 59 | Admitting: Obstetrics and Gynecology

## 2019-08-07 ENCOUNTER — Other Ambulatory Visit: Payer: Self-pay

## 2019-08-07 ENCOUNTER — Encounter: Payer: Self-pay | Admitting: Obstetrics and Gynecology

## 2019-08-07 ENCOUNTER — Ambulatory Visit (INDEPENDENT_AMBULATORY_CARE_PROVIDER_SITE_OTHER): Payer: 59 | Admitting: Obstetrics and Gynecology

## 2019-08-07 VITALS — BP 130/100 | Ht 63.0 in | Wt 202.0 lb

## 2019-08-07 DIAGNOSIS — Z13220 Encounter for screening for lipoid disorders: Secondary | ICD-10-CM

## 2019-08-07 DIAGNOSIS — Z01419 Encounter for gynecological examination (general) (routine) without abnormal findings: Secondary | ICD-10-CM

## 2019-08-07 DIAGNOSIS — Z130 Encounter for screening for diseases of the blood and blood-forming organs and certain disorders involving the immune mechanism: Secondary | ICD-10-CM

## 2019-08-07 DIAGNOSIS — Z1321 Encounter for screening for nutritional disorder: Secondary | ICD-10-CM

## 2019-08-07 DIAGNOSIS — Z0000 Encounter for general adult medical examination without abnormal findings: Secondary | ICD-10-CM

## 2019-08-07 DIAGNOSIS — Z1329 Encounter for screening for other suspected endocrine disorder: Secondary | ICD-10-CM

## 2019-08-07 DIAGNOSIS — Z23 Encounter for immunization: Secondary | ICD-10-CM

## 2019-08-07 DIAGNOSIS — Z30431 Encounter for routine checking of intrauterine contraceptive device: Secondary | ICD-10-CM

## 2019-08-07 DIAGNOSIS — Z131 Encounter for screening for diabetes mellitus: Secondary | ICD-10-CM

## 2019-08-07 DIAGNOSIS — K219 Gastro-esophageal reflux disease without esophagitis: Secondary | ICD-10-CM | POA: Insufficient documentation

## 2019-08-07 DIAGNOSIS — Z1239 Encounter for other screening for malignant neoplasm of breast: Secondary | ICD-10-CM

## 2019-08-07 DIAGNOSIS — F419 Anxiety disorder, unspecified: Secondary | ICD-10-CM | POA: Insufficient documentation

## 2019-08-07 MED ORDER — SERTRALINE HCL 50 MG PO TABS: ORAL_TABLET | ORAL | 3 refills | Status: DC

## 2019-08-07 MED ORDER — PANTOPRAZOLE SODIUM 40 MG PO TBEC: 40.0000 mg | DELAYED_RELEASE_TABLET | Freq: Every day | ORAL | 3 refills | Status: DC

## 2019-08-07 NOTE — Progress Notes (Signed)
PCP:  Patient, No Pcp Per   Chief Complaint  Patient presents with  . Gynecologic Exam  . Immunizations    flu shot today     HPI:      [name] is a 48 y.o. [id number] who LMP was No LMP recorded. (Menstrual status: IUD)., presents today for her annual examination.  Her menses are absent with IUD, occas light spotting for a few days only. Dysmenorrhea none.  Mirena replaced 02/06/17 for menorrhagia.   Sex activity: single partner, contraception - vasectomy.  Last Pap: December 11, 2016  Results were: no abnormalities /neg HPV DNA  Hx of STDs: none  Last mammogram: 01/03/18 Results were: normal--routine follow-up in 12 months There is no FH of breast cancer. There is no FH of ovarian cancer. The patient does do self-breast exams.  Tobacco use: The patient denies current or previous tobacco use. Alcohol use: none No drug use.  Exercise: not active  She does get adequate calcium but not Vitamin D in her diet. S/P bariatric surg. Due for yearly fasting labs. Normal except for borderline LDL 2019.  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 Started zoloft last yr. Did pretty well on 50 mg dose with sx improvement. Thought she could go off it so weaned off about 6 wks ago. Feels she needs to restart it. Noticed wt gain after stopping zoloft, most likely due to stress eating. Had done paxil in past but concerned about wt gain with it.    Takes pantoprazole for GERD and needs RF.  Past Medical History:  Diagnosis Date  . Anxiety   . Diabetes type 2, controlled ([place])   . Edema   . GERD (gastroesophageal reflux disease)   . Hyperlipidemia   . Menorrhagia   . Obesity     Past Surgical History:  Procedure Laterality Date  . CESAREAN SECTION  1995  . CHOLECYSTECTOMY  2004  . IUD placement  12/07/2011  . LAPAROSCOPIC GASTRIC BAND REMOVAL WITH LAPAROSCOPIC GASTRIC SLEEVE RESECTION  08/2016  . TOTAL HIP ARTHROPLASTY  2011, 2015  . weightloss      Family History   Problem Relation Age of Onset  . Stroke Father   . Hypertension Father     Social History   Socioeconomic History  . Marital status: Married    Spouse name: Not on file  . Number of children: Not on file  . Years of education: Not on file  . Highest education level: Not on file  Occupational History  . Not on file  Social Needs  . Financial resource strain: Not on file  . Food insecurity    Worry: Not on file    Inability: Not on file  . Transportation needs    Medical: Not on file    Non-medical: Not on file  Tobacco Use  . Smoking status: Never Smoker  . Smokeless tobacco: Never Used  Substance and Sexual Activity  . Alcohol use: No  . Drug use: No  . Sexual activity: Yes    Partners: Male    Birth control/protection: I.U.D., Surgical    Comment: Mirena/Vasectomy  Lifestyle  . Physical activity    Days per week: 1 day    Minutes per session: 30 min  . Stress: Rather much  Relationships  . Social connections    Talks on phone: More than three times a week    Gets together: Twice a week    Attends religious service: More  than 4 times per year    Active member of club or organization: No    Attends meetings of clubs or organizations: Never    Relationship status: Married  . Intimate partner violence    Fear of current or ex partner: No    Emotionally abused: No    Physically abused: No    Forced sexual activity: No  Other Topics Concern  . Not on file  Social History Narrative  . Not on file    Current Meds  Medication Sig  . [DISCONTINUED] omeprazole (PRILOSEC) 40 MG capsule Take by mouth.     ROS:  Review of Systems  Constitutional: Negative for fatigue, fever and unexpected weight change.  Respiratory: Negative for cough, shortness of breath and wheezing.   Cardiovascular: Negative for chest pain, palpitations and leg swelling.  Gastrointestinal: Negative for blood in stool, constipation, diarrhea, nausea and vomiting.  Endocrine: Negative for  cold intolerance, heat intolerance and polyuria.  Genitourinary: Negative for dyspareunia, dysuria, flank pain, frequency, genital sores, hematuria, menstrual problem, pelvic pain, urgency, vaginal bleeding, vaginal discharge and vaginal pain.  Musculoskeletal: Negative for back pain, joint swelling and myalgias.  Skin: Negative for rash.  Neurological: Negative for dizziness, syncope, light-headedness, numbness and headaches.  Hematological: Negative for adenopathy.  Psychiatric/Behavioral: Positive for agitation. Negative for confusion, sleep disturbance and suicidal ideas. The patient is not nervous/anxious.      Objective: BP (!) 130/100   Ht 5\' 3"  (1.6 m)   Wt 202 lb (91.6 kg)   BMI 35.78 kg/m    Physical Exam Constitutional:      Appearance: She is well-developed.  Genitourinary:     Vulva, vagina, uterus, right adnexa and left adnexa normal.     No vulval lesion or tenderness noted.     No vaginal discharge, erythema or tenderness.     No cervical motion tenderness or polyp.     IUD strings visualized.     Uterus is not enlarged or tender.     No right or left adnexal mass present.     Right adnexa not tender.     Left adnexa not tender.  Neck:     Musculoskeletal: Normal range of motion.     Thyroid: No thyromegaly.  Cardiovascular:     Rate and Rhythm: Normal rate and regular rhythm.     Heart sounds: Normal heart sounds. No murmur.  Pulmonary:     Effort: Pulmonary effort is normal.     Breath sounds: Normal breath sounds.  Chest:     Breasts:        Right: No mass, nipple discharge, skin change or tenderness.        Left: No mass, nipple discharge, skin change or tenderness.  Abdominal:     Palpations: Abdomen is soft.     Tenderness: There is no abdominal tenderness. There is no guarding.  Musculoskeletal: Normal range of motion.  Neurological:     General: No focal deficit present.     Mental Status: She is alert and oriented to person, place, and time.      Cranial Nerves: No cranial nerve deficit.  Skin:    General: Skin is warm and dry.  Psychiatric:        Mood and Affect: Mood normal.        Behavior: Behavior normal.        Thought Content: Thought content normal.        Judgment: Judgment normal.  Vitals signs reviewed.  Assessment/Plan: Encounter for annual routine gynecological examination  Screening for breast cancer--pt to sched mammo  Encounter for routine checking of intrauterine contraceptive device (IUD)--IUD in place.   Anxiety - Plan: sertraline (ZOLOFT) 50 MG tablet; Rx RF. Start with 50 mg dose for 8 wks. If sx not fully controlled, increase to 75 mg (1 1/2 tabs). F/u prn.   Gastroesophageal reflux disease, esophagitis presence not specified - Plan: pantoprazole (PROTONIX) 40 MG tablet; Rx RF.   Blood tests for routine general physical examination - Plan: MM 3D SCREEN BREAST BILATERAL, Comprehensive metabolic panel, Lipid panel, Hemoglobin A1c, TSH + free T4, VITAMIN D 25 Hydroxy (Vit-D Deficiency, Fractures), B12 and Folate Panel  Screening cholesterol level - Plan: Lipid panel  Encounter for vitamin deficiency screening - Plan: VITAMIN D 25 Hydroxy (Vit-D Deficiency, Fractures), B12 and Folate Panel  Thyroid disorder screening - Plan: TSH + free T4  Screening for diabetes mellitus - Plan: Hemoglobin A1c  Screening for deficiency anemia  Needs flu shot  Meds ordered this encounter  Medications  . sertraline (ZOLOFT) 50 MG tablet    Sig: TAKE 1 TABLET(50 MG) BY MOUTH DAILY    Dispense:  90 tablet    Refill:  3    Order Specific Question:   Supervising Provider    Answer:   Gae Dry [id number]  . pantoprazole (PROTONIX) 40 MG tablet    Sig: Take 1 tablet (40 mg total) by mouth daily.    Dispense:  90 tablet    Refill:  3    Order Specific Question:   Supervising Provider    Answer:   Gae Dry [id number]             GYN counsel breast self exam, mammography screening, adequate  intake of calcium and vitamin D, diet and exercise     F/U  Return in about 1 year (around 08/06/2020).  Avram Danielson B. Christinamarie Tall, PA-C 08/07/2019 5:05 PM

## 2019-08-07 NOTE — Patient Instructions (Addendum)
I value your feedback and entrusting us with your care. If you get a [facility] patient survey, I would appreciate you taking the time to let us know about your experience today. Thank you!  [facility]: [phone]    

## 2019-08-10 DIAGNOSIS — Z23 Encounter for immunization: Secondary | ICD-10-CM

## 2019-08-10 NOTE — Addendum Note (Signed)
Addended by: Drenda Freeze on: 08/10/2019 08:12 AM   Modules accepted: Orders

## 2019-08-11 ENCOUNTER — Other Ambulatory Visit: Payer: Self-pay

## 2019-08-11 ENCOUNTER — Other Ambulatory Visit: Payer: 59

## 2019-08-11 DIAGNOSIS — Z13220 Encounter for screening for lipoid disorders: Secondary | ICD-10-CM

## 2019-08-11 DIAGNOSIS — Z1321 Encounter for screening for nutritional disorder: Secondary | ICD-10-CM

## 2019-08-11 DIAGNOSIS — Z0000 Encounter for general adult medical examination without abnormal findings: Secondary | ICD-10-CM

## 2019-08-11 DIAGNOSIS — Z130 Encounter for screening for diseases of the blood and blood-forming organs and certain disorders involving the immune mechanism: Secondary | ICD-10-CM

## 2019-08-11 DIAGNOSIS — Z1329 Encounter for screening for other suspected endocrine disorder: Secondary | ICD-10-CM

## 2019-08-11 DIAGNOSIS — Z131 Encounter for screening for diabetes mellitus: Secondary | ICD-10-CM

## 2019-08-12 ENCOUNTER — Encounter: Payer: Self-pay | Admitting: Obstetrics and Gynecology

## 2019-08-12 LAB — HEMOGLOBIN A1C
Est. average glucose Bld gHb Est-mCnc: 114 mg/dL
Hgb A1c MFr Bld: 5.6 % (ref 4.8–5.6)

## 2019-08-12 LAB — LIPID PANEL
Chol/HDL Ratio: 4.1 ratio (ref 0.0–4.4)
Cholesterol, Total: 196 mg/dL (ref 100–199)
HDL: 48 mg/dL (ref >39)
LDL Chol Calc (NIH): 131 mg/dL — ABNORMAL HIGH (ref 0–99)
Triglycerides: 96 mg/dL (ref 0–149)
VLDL Cholesterol Cal: 17 mg/dL (ref 5–40)

## 2019-08-12 LAB — CBC
Hematocrit: 43.4 % (ref 34.0–46.6)
Hemoglobin: 14.7 g/dL (ref 11.1–15.9)
MCH: 31.7 pg (ref 26.6–33.0)
MCHC: 33.9 g/dL (ref 31.5–35.7)
MCV: 94 fL (ref 79–97)
Platelets: 327 10*3/uL (ref 150–450)
RBC: 4.64 x10E6/uL (ref 3.77–5.28)
RDW: 12.8 % (ref 11.7–15.4)
WBC: 5.8 10*3/uL (ref 3.4–10.8)

## 2019-08-12 LAB — B12 AND FOLATE PANEL
Folate: 13.3 ng/mL (ref >3.0)
Vitamin B-12: 721 pg/mL (ref 232–1245)

## 2019-08-12 LAB — TSH+FREE T4
Free T4: 1.15 ng/dL (ref 0.82–1.77)
TSH: 3.2 u[IU]/mL (ref 0.450–4.500)

## 2019-08-12 LAB — VITAMIN D 25 HYDROXY (VIT D DEFICIENCY, FRACTURES): Vit D, 25-Hydroxy: 33.3 ng/mL (ref 30.0–100.0)

## 2019-09-28 ENCOUNTER — Other Ambulatory Visit: Payer: Self-pay | Admitting: Obstetrics and Gynecology

## 2019-09-28 ENCOUNTER — Encounter: Payer: Self-pay | Admitting: Obstetrics and Gynecology

## 2019-09-28 DIAGNOSIS — F419 Anxiety disorder, unspecified: Secondary | ICD-10-CM

## 2019-09-28 MED ORDER — SERTRALINE HCL 50 MG PO TABS: 75.0000 mg | ORAL_TABLET | Freq: Every day | ORAL | 3 refills | Status: DC

## 2019-09-28 NOTE — Progress Notes (Signed)
Rx change sertraline to 75 mg dose daily (increased from 50 mg daily).

## 2020-01-07 ENCOUNTER — Encounter: Payer: Self-pay | Admitting: Obstetrics and Gynecology

## 2020-03-20 ENCOUNTER — Encounter: Payer: Self-pay | Admitting: Obstetrics and Gynecology

## 2020-03-20 DIAGNOSIS — F419 Anxiety disorder, unspecified: Secondary | ICD-10-CM

## 2020-03-21 MED ORDER — SERTRALINE HCL 50 MG PO TABS: 100.0000 mg | ORAL_TABLET | Freq: Every day | ORAL | 1 refills | Status: DC

## 2020-04-23 ENCOUNTER — Ambulatory Visit: Payer: 59

## 2020-06-07 ENCOUNTER — Telehealth: Payer: Self-pay | Admitting: Obstetrics and Gynecology

## 2020-06-07 ENCOUNTER — Other Ambulatory Visit: Payer: Self-pay | Admitting: Obstetrics and Gynecology

## 2020-06-07 DIAGNOSIS — K219 Gastro-esophageal reflux disease without esophagitis: Secondary | ICD-10-CM

## 2020-06-07 MED ORDER — PANTOPRAZOLE SODIUM 40 MG PO TBEC: 40.0000 mg | DELAYED_RELEASE_TABLET | Freq: Every day | ORAL | 0 refills | Status: DC

## 2020-06-07 NOTE — Progress Notes (Signed)
Rx RF protonix for GERD till 9/21 annual

## 2020-06-07 NOTE — Telephone Encounter (Signed)
Patient is schedule for annual exam for 08/10/20 with ABC. Patient is requesting refills on medication to get to her appointment. Please advise

## 2020-06-07 NOTE — Telephone Encounter (Signed)
Rx RF eRxd.  

## 2020-06-07 NOTE — Telephone Encounter (Signed)
Pt needs refill on pantoprazole.

## 2020-06-25 ENCOUNTER — Encounter: Payer: Self-pay | Admitting: Obstetrics and Gynecology

## 2020-08-09 ENCOUNTER — Other Ambulatory Visit: Payer: Self-pay

## 2020-08-09 ENCOUNTER — Encounter: Payer: Self-pay | Admitting: Obstetrics and Gynecology

## 2020-08-09 ENCOUNTER — Ambulatory Visit (INDEPENDENT_AMBULATORY_CARE_PROVIDER_SITE_OTHER): Payer: BC Managed Care – PPO | Admitting: Obstetrics and Gynecology

## 2020-08-09 VITALS — BP 120/70 | Ht 63.0 in | Wt 208.0 lb

## 2020-08-09 DIAGNOSIS — F419 Anxiety disorder, unspecified: Secondary | ICD-10-CM

## 2020-08-09 DIAGNOSIS — Z713 Dietary counseling and surveillance: Secondary | ICD-10-CM

## 2020-08-09 DIAGNOSIS — Z1231 Encounter for screening mammogram for malignant neoplasm of breast: Secondary | ICD-10-CM

## 2020-08-09 DIAGNOSIS — Z0000 Encounter for general adult medical examination without abnormal findings: Secondary | ICD-10-CM

## 2020-08-09 DIAGNOSIS — Z01419 Encounter for gynecological examination (general) (routine) without abnormal findings: Secondary | ICD-10-CM

## 2020-08-09 DIAGNOSIS — Z1211 Encounter for screening for malignant neoplasm of colon: Secondary | ICD-10-CM

## 2020-08-09 DIAGNOSIS — Z130 Encounter for screening for diseases of the blood and blood-forming organs and certain disorders involving the immune mechanism: Secondary | ICD-10-CM

## 2020-08-09 DIAGNOSIS — Z131 Encounter for screening for diabetes mellitus: Secondary | ICD-10-CM

## 2020-08-09 DIAGNOSIS — Z13220 Encounter for screening for lipoid disorders: Secondary | ICD-10-CM

## 2020-08-09 DIAGNOSIS — K219 Gastro-esophageal reflux disease without esophagitis: Secondary | ICD-10-CM

## 2020-08-09 DIAGNOSIS — Z30431 Encounter for routine checking of intrauterine contraceptive device: Secondary | ICD-10-CM | POA: Diagnosis not present

## 2020-08-09 DIAGNOSIS — Z1321 Encounter for screening for nutritional disorder: Secondary | ICD-10-CM

## 2020-08-09 DIAGNOSIS — Z1329 Encounter for screening for other suspected endocrine disorder: Secondary | ICD-10-CM

## 2020-08-09 MED ORDER — PANTOPRAZOLE SODIUM 40 MG PO TBEC: 40.0000 mg | DELAYED_RELEASE_TABLET | Freq: Every day | ORAL | 3 refills | Status: DC

## 2020-08-09 MED ORDER — PHENTERMINE HCL 37.5 MG PO TABS: 37.5000 mg | ORAL_TABLET | Freq: Every day | ORAL | 1 refills | Status: DC

## 2020-08-09 MED ORDER — SERTRALINE HCL 100 MG PO TABS: 100.0000 mg | ORAL_TABLET | Freq: Every day | ORAL | 3 refills | Status: DC

## 2020-08-09 NOTE — Progress Notes (Signed)
PCP:  Patient, No Pcp Per   Chief Complaint  Patient presents with  . Gynecologic Exam     HPI:      Jade Medina is a 49 y.o. [id number] who LMP was No LMP recorded. (Menstrual status: IUD)., presents today for her annual examination.  Her menses are absent with IUD, occas light spotting for a few days only. Dysmenorrhea none. Mirena REplaced 02/06/17 for menorrhagia.   Sex activity: single partner, contraception - vasectomy and IUD.  Last Pap: December 11, 2016  Results were: no abnormalities /neg HPV DNA  Hx of STDs: none  Last mammogram: 01/03/18 Results were: normal--routine follow-up in 12 months There is no FH of breast cancer. There is no FH of ovarian cancer. The patient does do self-breast exams.  Tobacco use: The patient denies current or previous tobacco use. Alcohol use: none No drug use.  Exercise: min active  She does get adequate calcium and Vitamin D in her diet. S/P bariatric surg. Due for yearly fasting labs. Normal except for borderline LDL 2019 and 2020  Colonoscopy: never Takes pantoprazole for GERD and needs RF. Will have GI f/u at appt.  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 Started zoloft 2 yr ago, taking 100 mg now with sx control. Want to cont for now.   Pt trying to lose wt. Doing keto somewhat. Thought about doing bariatric surg revision but changed her mind. Would like to do phentermine--took in past with some wt loss.  Past Medical History:  Diagnosis Date  . Anxiety   . Diabetes type 2, controlled ([place])   . Edema   . GERD (gastroesophageal reflux disease)   . Hyperlipidemia   . Menorrhagia   . Obesity     Past Surgical History:  Procedure Laterality Date  . CESAREAN SECTION  1995  . CHOLECYSTECTOMY  2004  . IUD placement  12/07/2011  . LAPAROSCOPIC GASTRIC BAND REMOVAL WITH LAPAROSCOPIC GASTRIC SLEEVE RESECTION  08/2016  . TOTAL HIP ARTHROPLASTY  2011, 2015  . weightloss      Family History  Problem Relation Age  of Onset  . Stroke Father   . Hypertension Father     Social History   Socioeconomic History  . Marital status: Married    Spouse name: Not on file  . Number of children: Not on file  . Years of education: Not on file  . Highest education level: Not on file  Occupational History  . Not on file  Tobacco Use  . Smoking status: Never Smoker  . Smokeless tobacco: Never Used  Vaping Use  . Vaping Use: Never used  Substance and Sexual Activity  . Alcohol use: No  . Drug use: No  . Sexual activity: Yes    Partners: Male    Birth control/protection: I.U.D., Surgical    Comment: Mirena/Vasectomy  Other Topics Concern  . Not on file  Social History Narrative  . Not on file   Social Determinants of Health   Financial Resource Strain:   . Difficulty of Paying Living Expenses: Not on file  Food Insecurity:   . Worried About Charity fundraiser in the Last Year: Not on file  . Ran Out of Food in the Last Year: Not on file  Transportation Needs:   . Lack of Transportation (Medical): Not on file  . Lack of Transportation (Non-Medical): Not on file  Physical Activity:   . Days of Exercise per Week: Not on file  .  Minutes of Exercise per Session: Not on file  Stress:   . Feeling of Stress : Not on file  Social Connections:   . Frequency of Communication with Friends and Family: Not on file  . Frequency of Social Gatherings with Friends and Family: Not on file  . Attends Religious Services: Not on file  . Active Member of Clubs or Organizations: Not on file  . Attends Archivist Meetings: Not on file  . Marital Status: Not on file  Intimate Partner Violence:   . Fear of Current or Ex-Partner: Not on file  . Emotionally Abused: Not on file  . Physically Abused: Not on file  . Sexually Abused: Not on file    Current Meds  Medication Sig  . albuterol (VENTOLIN HFA) 108 (90 Base) MCG/ACT inhaler Inhale into the lungs.  . [DISCONTINUED] pantoprazole (PROTONIX) 40 MG  tablet Take 1 tablet (40 mg total) by mouth daily.  . [DISCONTINUED] sertraline (ZOLOFT) 50 MG tablet Take 2 tablets (100 mg total) by mouth daily.  . pantoprazole (PROTONIX) 40 MG tablet Take 1 tablet (40 mg total) by mouth daily.  . sertraline (ZOLOFT) 100 MG tablet Take 1 tablet (100 mg total) by mouth daily.     ROS:  Review of Systems  Constitutional: Negative for fatigue, fever and unexpected weight change.  Respiratory: Negative for cough, shortness of breath and wheezing.   Cardiovascular: Negative for chest pain, palpitations and leg swelling.  Gastrointestinal: Negative for blood in stool, constipation, diarrhea, nausea and vomiting.  Endocrine: Negative for cold intolerance, heat intolerance and polyuria.  Genitourinary: Negative for dyspareunia, dysuria, flank pain, frequency, genital sores, hematuria, menstrual problem, pelvic pain, urgency, vaginal bleeding, vaginal discharge and vaginal pain.  Musculoskeletal: Negative for back pain, joint swelling and myalgias.  Skin: Negative for rash.  Neurological: Negative for dizziness, syncope, light-headedness, numbness and headaches.  Hematological: Negative for adenopathy.  Psychiatric/Behavioral: Negative for agitation, confusion, sleep disturbance and suicidal ideas. The patient is not nervous/anxious.      Objective: BP 120/70   Ht 5\' 3"  (1.6 m)   Wt 208 lb (94.3 kg)   BMI 36.85 kg/m  Up 6# since 9/20 appt  Physical Exam Constitutional:      Appearance: She is well-developed.  Genitourinary:     Vulva, vagina, uterus, right adnexa and left adnexa normal.     No vulval lesion or tenderness noted.     No vaginal discharge, erythema or tenderness.     No cervical motion tenderness or polyp.     IUD strings visualized.     Uterus is not enlarged or tender.     No right or left adnexal mass present.     Right adnexa not tender.     Left adnexa not tender.  Neck:     Thyroid: No thyromegaly.  Cardiovascular:      Rate and Rhythm: Normal rate and regular rhythm.     Heart sounds: Normal heart sounds. No murmur heard.   Pulmonary:     Effort: Pulmonary effort is normal.     Breath sounds: Normal breath sounds.  Chest:     Breasts:        Right: No mass, nipple discharge, skin change or tenderness.        Left: No mass, nipple discharge, skin change or tenderness.  Abdominal:     Palpations: Abdomen is soft.     Tenderness: There is no abdominal tenderness. There is no guarding.  Musculoskeletal:  General: Normal range of motion.     Cervical back: Normal range of motion.  Neurological:     General: No focal deficit present.     Mental Status: She is alert and oriented to person, place, and time.     Cranial Nerves: No cranial nerve deficit.  Skin:    General: Skin is warm and dry.  Psychiatric:        Mood and Affect: Mood normal.        Behavior: Behavior normal.        Thought Content: Thought content normal.        Judgment: Judgment normal.  Vitals reviewed.     Assessment/Plan: Encounter for annual routine gynecological examination  Encounter for screening mammogram for malignant neoplasm of breast - Plan: MM 3D SCREEN BREAST BILATERAL; pt to check mammo  Encounter for routine checking of intrauterine contraceptive device (IUD); IUD strings in place. Doing well  Screening for colon cancer - Plan: Ambulatory referral to Gastroenterology; refer to GI for scr colonoscopy due to age  Gastroesophageal reflux disease - Plan: pantoprazole (PROTONIX) 40 MG tablet; Rx RF. Wt loss to help with sx. Have GI eval at appt.  Anxiety - Plan: sertraline (ZOLOFT) 100 MG tablet; Rx RF zoloft. F/u prn to wean down.  Blood tests for routine general physical examination - Plan: TSH + free T4, VITAMIN D 25 Hydroxy (Vit-D Deficiency, Fractures), B12 and Folate Panel, Hemoglobin A1c, CBC, Lipid panel, Comprehensive metabolic panel  Screening cholesterol level - Plan: Lipid panel  Encounter for  vitamin deficiency screening - Plan: VITAMIN D 25 Hydroxy (Vit-D Deficiency, Fractures), B12 and Folate Panel  Thyroid disorder screening - Plan: TSH + free T4  Screening for diabetes mellitus - Plan: Hemoglobin A1c  Screening for deficiency anemia - Plan: B12 and Folate Panel, CBC  Weight loss counseling, encounter for - Plan: phentermine (ADIPEX-P) 37.5 MG tablet; Diet/exercise for wt loss. Discussed keto, intermittent fasting. Try adipex. F/u prn.     Meds ordered this encounter  Medications  . sertraline (ZOLOFT) 100 MG tablet    Sig: Take 1 tablet (100 mg total) by mouth daily.    Dispense:  90 tablet    Refill:  3    Order Specific Question:   Supervising Provider    Answer:   Gae Dry [id number]  . pantoprazole (PROTONIX) 40 MG tablet    Sig: Take 1 tablet (40 mg total) by mouth daily.    Dispense:  90 tablet    Refill:  3    Order Specific Question:   Supervising Provider    Answer:   Gae Dry [id number]  . phentermine (ADIPEX-P) 37.5 MG tablet    Sig: Take 1 tablet (37.5 mg total) by mouth daily before breakfast.    Dispense:  30 tablet    Refill:  1    Order Specific Question:   Supervising Provider    Answer:   Gae Dry [[id number]]             GYN counsel breast self exam, mammography screening, adequate intake of calcium and vitamin D, diet and exercise     F/U  Return in about 1 year (around 08/09/2021).  Jawann Urbani B. Breindy Meadow, PA-C 08/09/2020 4:23 PM

## 2020-08-09 NOTE — Patient Instructions (Addendum)
I value your feedback and entrusting us with your care. If you get a [facility] patient survey, I would appreciate you taking the time to let us know about your experience today. Thank you!  As of October 29, 2019, your lab results will be released to your MyChart immediately, before I even have a chance to see them. Please give me time to review them and contact you if there are any abnormalities. Thank you for your patience.   [facility]: [phone]  [facility]: [phone]  

## 2020-09-12 ENCOUNTER — Other Ambulatory Visit: Payer: Self-pay

## 2020-09-12 ENCOUNTER — Ambulatory Visit
Admission: RE | Admit: 2020-09-12 | Discharge: 2020-09-12 | Disposition: A | Discharge status: Home / Self Care | Payer: BC Managed Care – PPO | Source: Ambulatory Visit | Attending: Obstetrics and Gynecology | Admitting: Obstetrics and Gynecology

## 2020-09-12 DIAGNOSIS — Z1231 Encounter for screening mammogram for malignant neoplasm of breast: Secondary | ICD-10-CM | POA: Diagnosis present

## 2020-09-14 ENCOUNTER — Other Ambulatory Visit: Payer: Self-pay | Admitting: Obstetrics and Gynecology

## 2020-09-14 DIAGNOSIS — F419 Anxiety disorder, unspecified: Secondary | ICD-10-CM

## 2020-10-24 ENCOUNTER — Ambulatory Visit (INDEPENDENT_AMBULATORY_CARE_PROVIDER_SITE_OTHER): Payer: BC Managed Care – PPO | Admitting: Gastroenterology

## 2020-10-24 ENCOUNTER — Other Ambulatory Visit: Payer: Self-pay

## 2020-10-24 ENCOUNTER — Encounter: Payer: Self-pay | Admitting: Gastroenterology

## 2020-10-24 VITALS — BP 118/80 | HR 109 | Ht 63.0 in | Wt 207.2 lb

## 2020-10-24 DIAGNOSIS — K219 Gastro-esophageal reflux disease without esophagitis: Secondary | ICD-10-CM

## 2020-10-24 DIAGNOSIS — Z1211 Encounter for screening for malignant neoplasm of colon: Secondary | ICD-10-CM | POA: Diagnosis not present

## 2020-10-24 MED ORDER — PEG 3350-KCL-NA BICARB-NACL 420 G PO SOLR: 4000.0000 mL | Freq: Once | ORAL | 0 refills | Status: AC

## 2020-10-24 NOTE — Patient Instructions (Signed)

## 2020-10-24 NOTE — Progress Notes (Signed)
Jonathon Bellows MD, MRCP(U.K) [address]  [place]  [place], [place] [address]  Main: [phone]  Fax: [phone]   Gastroenterology Consultation  Referring Provider:     Amalia Greenhouse Primary Care Physician:  Patient, No Pcp Per Primary Gastroenterologist:  Dr. Jonathon Bellows  Reason for Consultation:     GERD and colon cancer screening        HPI:   Jade Medina is a 49 y.o. y/o female referred for GERD and colon cancer screening.  She has had acid reflux for over 15 years.  Had gained weight previously but has stabilized.  Her symptoms are mainly nocturnal when she wakes up with burning sensation in her throat.  She has been taking Protonix once in the night after dinner time for many years.  Has improvement in her symptoms but not completely.  Does not use a wedge pillow.  No prior endoscopic evaluation.  Never had a colonoscopy.  No family history of colon cancer or polyps.  Not on any blood thinners.  No other symptoms.    Past Medical History:  Diagnosis Date  . Anxiety   . Diabetes type 2, controlled ([place])   . Edema   . GERD (gastroesophageal reflux disease)   . Hyperlipidemia   . Menorrhagia   . Obesity     Past Surgical History:  Procedure Laterality Date  . CESAREAN SECTION  1995  . CHOLECYSTECTOMY  2004  . IUD placement  12/07/2011  . LAPAROSCOPIC GASTRIC BAND REMOVAL WITH LAPAROSCOPIC GASTRIC SLEEVE RESECTION  08/2016  . TOTAL HIP ARTHROPLASTY  2011, 2015  . weightloss      Prior to Admission medications   Medication Sig Start Date End Date Taking? Authorizing Provider  albuterol (VENTOLIN HFA) 108 (90 Base) MCG/ACT inhaler Inhale into the lungs. 04/21/20   [provider]  levonorgestrel (MIRENA, 52 MG,) 20 MCG/24HR IUD 1 Intra Uterine Device (1 each total) by Intrauterine route once. 02/06/17 3/84/66  Copland, Deirdre Evener, PA-C  pantoprazole (PROTONIX) 40 MG tablet Take 1 tablet (40 mg total) by mouth daily. 5/99/35   Copland, Deirdre Evener, PA-C  phentermine (ADIPEX-P) 37.5 MG tablet Take 1 tablet (37.5 mg total) by mouth daily before breakfast. 05/19/76   Copland, Elmo Putt B, PA-C  sertraline (ZOLOFT) 100 MG tablet Take 1 tablet (100 mg total) by mouth daily. 9/39/03   Copland, Deirdre Evener, PA-C    Family History  Problem Relation Age of Onset  . Stroke Father   . Hypertension Father      Social History   Tobacco Use  . Smoking status: Never Smoker  . Smokeless tobacco: Never Used  Vaping Use  . Vaping Use: Never used  Substance Use Topics  . Alcohol use: No  . Drug use: No    Allergies as of 10/24/2020 - Review Complete 08/09/2020  Allergen Reaction Noted  . Morphine Nausea And Vomiting 08/20/2016    Review of Systems:    All systems reviewed and negative except where noted in HPI.   Physical Exam:  There were no vitals taken for this visit. No LMP recorded. (Menstrual status: IUD). Psych:  Alert and cooperative. Normal mood and affect. General:   Alert,  Well-developed, well-nourished, pleasant and cooperative in NAD Head:  Normocephalic and atraumatic. Eyes:  Sclera clear, no icterus.   Conjunctiva pink. Ears:  Normal auditory acuity. Lungs:  Respirations even and unlabored.  Clear throughout to auscultation.   No wheezes, crackles, or rhonchi. No  acute distress. Heart:  Regular rate and rhythm; no murmurs, clicks, rubs, or gallops. Abdomen:  Normal bowel sounds.  No bruits.  Soft, non-tender and non-distended without masses, hepatosplenomegaly or hernias noted.  No guarding or rebound tenderness.    Neurologic:  Alert and oriented x3;  grossly normal neurologically. Psych:  Alert and cooperative. Normal mood and affect.  Imaging Studies: No results found.  Assessment and Plan:   Jade Medina is a 49 y.o. y/o female has been referred for GERD and colon cancer screening  1. GERD : Counseled on life style changes, suggest to use PPI first thing in the morning on empty stomach and eat 30 minutes  after. Advised on the use of a wedge pillow at night, weight loss, avoid meals for 2 hours prior to bed time.  .Discussed the risks and benefits of long term PPI use including but not limited to bone loss, chronic kidney disease, infections , low magnesium . Aim to use at the lowest dose for the shortest period of time  Will perform EGD to screen for Barrett's esophagus.  She has noted that she should take her PPI 1st thing in the morning rather than after her dinner at night.  At her next visit we will plan to try and reduce the dose of her PPI if she is doing well.  2.  Colon cancer screening: Colonoscopy   I have discussed alternative options, risks & benefits,  which include, but are not limited to, bleeding, infection, perforation,respiratory complication & drug reaction.  The patient agrees with this plan & written consent will be obtained.     Follow up in 8 weeks  Dr Jonathon Bellows MD,MRCP(U.K)

## 2020-10-30 ENCOUNTER — Other Ambulatory Visit: Payer: Self-pay | Admitting: Obstetrics and Gynecology

## 2020-10-30 DIAGNOSIS — F419 Anxiety disorder, unspecified: Secondary | ICD-10-CM

## 2020-11-08 ENCOUNTER — Other Ambulatory Visit
Admission: RE | Admit: 2020-11-08 | Discharge: 2020-11-08 | Disposition: A | Discharge status: Home / Self Care | Payer: BC Managed Care – PPO | Source: Ambulatory Visit | Attending: Gastroenterology | Admitting: Gastroenterology

## 2020-11-08 ENCOUNTER — Other Ambulatory Visit: Payer: Self-pay

## 2020-11-08 DIAGNOSIS — D124 Benign neoplasm of descending colon: Secondary | ICD-10-CM | POA: Diagnosis not present

## 2020-11-08 DIAGNOSIS — Z01812 Encounter for preprocedural laboratory examination: Secondary | ICD-10-CM | POA: Insufficient documentation

## 2020-11-08 DIAGNOSIS — Z20822 Contact with and (suspected) exposure to covid-19: Secondary | ICD-10-CM | POA: Insufficient documentation

## 2020-11-08 DIAGNOSIS — D122 Benign neoplasm of ascending colon: Secondary | ICD-10-CM | POA: Diagnosis not present

## 2020-11-08 DIAGNOSIS — Z1211 Encounter for screening for malignant neoplasm of colon: Secondary | ICD-10-CM | POA: Diagnosis present

## 2020-11-08 DIAGNOSIS — Z9884 Bariatric surgery status: Secondary | ICD-10-CM | POA: Diagnosis not present

## 2020-11-08 DIAGNOSIS — Z885 Allergy status to narcotic agent status: Secondary | ICD-10-CM | POA: Diagnosis not present

## 2020-11-08 DIAGNOSIS — Z13810 Encounter for screening for upper gastrointestinal disorder: Secondary | ICD-10-CM | POA: Diagnosis present

## 2020-11-08 DIAGNOSIS — Z793 Long term (current) use of hormonal contraceptives: Secondary | ICD-10-CM | POA: Diagnosis not present

## 2020-11-08 DIAGNOSIS — D123 Benign neoplasm of transverse colon: Secondary | ICD-10-CM | POA: Diagnosis not present

## 2020-11-08 DIAGNOSIS — Z79899 Other long term (current) drug therapy: Secondary | ICD-10-CM | POA: Diagnosis not present

## 2020-11-08 DIAGNOSIS — Z975 Presence of (intrauterine) contraceptive device: Secondary | ICD-10-CM | POA: Diagnosis not present

## 2020-11-08 LAB — SARS CORONAVIRUS 2 (TAT 6-24 HRS): SARS Coronavirus 2: NEGATIVE

## 2020-11-09 ENCOUNTER — Encounter: Payer: Self-pay | Admitting: Gastroenterology

## 2020-11-10 ENCOUNTER — Encounter: Payer: Self-pay | Admitting: Gastroenterology

## 2020-11-10 ENCOUNTER — Ambulatory Visit: Payer: BC Managed Care – PPO | Admitting: Anesthesiology

## 2020-11-10 ENCOUNTER — Other Ambulatory Visit: Payer: Self-pay

## 2020-11-10 ENCOUNTER — Encounter
Admission: RE | Disposition: A | Discharge status: Home / Self Care | Payer: Self-pay | Source: Home / Self Care | Attending: Gastroenterology

## 2020-11-10 ENCOUNTER — Ambulatory Visit
Admission: RE | Admit: 2020-11-10 | Discharge: 2020-11-10 | Disposition: A | Discharge status: Home / Self Care | Payer: BC Managed Care – PPO | Attending: Gastroenterology | Admitting: Gastroenterology

## 2020-11-10 DIAGNOSIS — D124 Benign neoplasm of descending colon: Secondary | ICD-10-CM | POA: Insufficient documentation

## 2020-11-10 DIAGNOSIS — K635 Polyp of colon: Secondary | ICD-10-CM | POA: Diagnosis not present

## 2020-11-10 DIAGNOSIS — K219 Gastro-esophageal reflux disease without esophagitis: Secondary | ICD-10-CM | POA: Diagnosis not present

## 2020-11-10 DIAGNOSIS — D123 Benign neoplasm of transverse colon: Secondary | ICD-10-CM | POA: Insufficient documentation

## 2020-11-10 DIAGNOSIS — D122 Benign neoplasm of ascending colon: Secondary | ICD-10-CM | POA: Insufficient documentation

## 2020-11-10 DIAGNOSIS — Z9884 Bariatric surgery status: Secondary | ICD-10-CM | POA: Insufficient documentation

## 2020-11-10 DIAGNOSIS — Z79899 Other long term (current) drug therapy: Secondary | ICD-10-CM | POA: Insufficient documentation

## 2020-11-10 DIAGNOSIS — Z885 Allergy status to narcotic agent status: Secondary | ICD-10-CM | POA: Insufficient documentation

## 2020-11-10 DIAGNOSIS — Z13810 Encounter for screening for upper gastrointestinal disorder: Secondary | ICD-10-CM | POA: Insufficient documentation

## 2020-11-10 DIAGNOSIS — Z20822 Contact with and (suspected) exposure to covid-19: Secondary | ICD-10-CM | POA: Insufficient documentation

## 2020-11-10 DIAGNOSIS — Z975 Presence of (intrauterine) contraceptive device: Secondary | ICD-10-CM | POA: Insufficient documentation

## 2020-11-10 DIAGNOSIS — Z1211 Encounter for screening for malignant neoplasm of colon: Secondary | ICD-10-CM | POA: Diagnosis not present

## 2020-11-10 DIAGNOSIS — Z793 Long term (current) use of hormonal contraceptives: Secondary | ICD-10-CM | POA: Insufficient documentation

## 2020-11-10 HISTORY — PX: ESOPHAGOGASTRODUODENOSCOPY (EGD) WITH PROPOFOL: SHX5813

## 2020-11-10 HISTORY — PX: COLONOSCOPY WITH PROPOFOL: SHX5780

## 2020-11-10 LAB — POCT PREGNANCY, URINE: Preg Test, Ur: NEGATIVE

## 2020-11-10 SURGERY — COLONOSCOPY WITH PROPOFOL
Anesthesia: General

## 2020-11-10 MED ORDER — SODIUM CHLORIDE 0.9 % IV SOLN: INTRAVENOUS | Status: DC

## 2020-11-10 MED ORDER — MIDAZOLAM HCL 2 MG/2ML IJ SOLN
INTRAMUSCULAR | Status: AC
  Filled 2020-11-10: qty 2

## 2020-11-10 MED ORDER — LIDOCAINE HCL (CARDIAC) PF 100 MG/5ML IV SOSY
PREFILLED_SYRINGE | INTRAVENOUS | Status: DC | PRN
  Administered 2020-11-10: 50 mg via INTRAVENOUS

## 2020-11-10 MED ORDER — PROPOFOL 500 MG/50ML IV EMUL
INTRAVENOUS | Status: DC | PRN
  Administered 2020-11-10: 130 ug/kg/min via INTRAVENOUS

## 2020-11-10 MED ORDER — MIDAZOLAM HCL 2 MG/2ML IJ SOLN
INTRAMUSCULAR | Status: DC | PRN
  Administered 2020-11-10: 2 mg via INTRAVENOUS

## 2020-11-10 MED ORDER — PROPOFOL 500 MG/50ML IV EMUL
INTRAVENOUS | Status: AC
  Filled 2020-11-10: qty 50

## 2020-11-10 MED ORDER — LIDOCAINE HCL (PF) 2 % IJ SOLN
INTRAMUSCULAR | Status: AC
  Filled 2020-11-10: qty 5

## 2020-11-10 MED ORDER — PROPOFOL 10 MG/ML IV BOLUS
INTRAVENOUS | Status: DC | PRN
  Administered 2020-11-10: 80 mg via INTRAVENOUS

## 2020-11-10 NOTE — Transfer of Care (Signed)
Immediate Anesthesia Transfer of Care Note  Patient: Jade Medina  Procedure(s) Performed: COLONOSCOPY WITH PROPOFOL (N/A ) ESOPHAGOGASTRODUODENOSCOPY (EGD) WITH PROPOFOL (N/A )  Patient Location: Endoscopy Unit  Anesthesia Type:General  Level of Consciousness: drowsy and patient cooperative  Airway & Oxygen Therapy: Patient Spontanous Breathing  Post-op Assessment: Report given to RN and Post -op Vital signs reviewed and stable  Post vital signs: Reviewed and stable  Last Vitals:  Vitals Value Taken Time  BP 131/87 11/10/20 0847  Temp 36.3 C 11/10/20 0846  Pulse 68 11/10/20 0848  Resp 21 11/10/20 0848  SpO2 100 % 11/10/20 0848  Vitals shown include unvalidated device data.  Last Pain:  Vitals:   11/10/20 0846  TempSrc: Temporal  PainSc: 0-No pain         Complications: No complications documented.

## 2020-11-10 NOTE — Op Note (Signed)
[facility] Gastroenterology Patient Name: Jade Medina Procedure Date: 11/10/2020 7:55 AM MRN: [id number] Account #: [id number] Date of Birth: [birth year] Admit Type: Outpatient Age: 49 Room: [facility] ENDO ROOM 4 Gender: Female Note Status: Finalized Procedure:             Colonoscopy Indications:           Screening for colorectal malignant neoplasm Providers:             Jonathon Bellows MD, MD Referring MD:          Ardeth Perfect Medicines:             Monitored Anesthesia Care Complications:         No immediate complications. Procedure:             Pre-Anesthesia Assessment:                        - Prior to the procedure, a History and Physical was                         performed, and patient medications, allergies and                         sensitivities were reviewed. The patient's tolerance                         of previous anesthesia was reviewed.                        - The risks and benefits of the procedure and the                         sedation options and risks were discussed with the                         patient. All questions were answered and informed                         consent was obtained.                        - ASA Grade Assessment: II - A patient with mild                         systemic disease.                        After obtaining informed consent, the colonoscope was                         passed under direct vision. Throughout the procedure,                         the patient's blood pressure, pulse, and oxygen                         saturations were monitored continuously. The                         Colonoscope was introduced through the anus and  advanced to the the cecum, identified by the                         appendiceal orifice. The colonoscopy was performed                         with ease. The patient tolerated the procedure well.                         The quality of the bowel  preparation was adequate. Findings:      The perianal and digital rectal examinations were normal.      Two sessile polyps were found in the descending colon. The polyps were 4       to 6 mm in size. These polyps were removed with a cold snare. Resection       and retrieval were complete.      Three sessile polyps were found in the ascending colon. The polyps were       4 to 6 mm in size. These polyps were removed with a cold snare.       Resection and retrieval were complete.      Three sessile polyps were found in the transverse colon. The polyps were       4 to 6 mm in size. These polyps were removed with a cold snare.       Resection and retrieval were complete.      The exam was otherwise without abnormality on direct and retroflexion       views. Impression:            - Two 4 to 6 mm polyps in the descending colon,                         removed with a cold snare. Resected and retrieved.                        - Three 4 to 6 mm polyps in the ascending colon,                         removed with a cold snare. Resected and retrieved.                        - Three 4 to 6 mm polyps in the transverse colon,                         removed with a cold snare. Resected and retrieved.                        - The examination was otherwise normal on direct and                         retroflexion views. Recommendation:        - Discharge patient to home (with escort).                        - Resume previous diet.                        - Continue present medications.                        -  Await pathology results.                        - Repeat colonoscopy in 3 years for surveillance. Procedure Code(s):     --- Professional ---                        [phone], Colonoscopy, flexible; with removal of                         tumor(s), polyp(s), or other lesion(s) by snare                         technique Diagnosis Code(s):     --- Professional ---                        Z12.11,  Encounter for screening for malignant neoplasm                         of colon                        K63.5, Polyp of colon CPT copyright 2019 American Medical Association. All rights reserved. The codes documented in this report are preliminary and upon coder review may  be revised to meet current compliance requirements. Jonathon Bellows, MD Jonathon Bellows MD, MD 11/10/2020 8:45:23 AM This report has been signed electronically. Number of Addenda: 0 Note Initiated On: 11/10/2020 7:55 AM Scope Withdrawal Time: 0 hours 20 minutes 3 seconds  Total Procedure Duration: 0 hours 25 minutes 57 seconds  Estimated Blood Loss:  Estimated blood loss: none.      [facility]

## 2020-11-10 NOTE — Anesthesia Preprocedure Evaluation (Addendum)
Anesthesia Evaluation  Patient identified by MRN, date of birth, ID band Patient awake    Reviewed: Allergy & Precautions, H&P , NPO status , Patient's Chart, lab work & pertinent test results  Airway Mallampati: II  TM Distance: >3 FB Neck ROM: Full    Dental no notable dental hx.    Pulmonary neg pulmonary ROS,    Pulmonary exam normal        Cardiovascular negative cardio ROS Normal cardiovascular exam     Neuro/Psych Anxiety negative neurological ROS  negative psych ROS   GI/Hepatic Neg liver ROS, GERD  Medicated and Controlled,  Endo/Other  negative endocrine ROSdiabetes, Well Controlled  Renal/GU negative Renal ROS  negative genitourinary   Musculoskeletal negative musculoskeletal ROS (+)   Abdominal   Peds negative pediatric ROS (+)  Hematology negative hematology ROS (+)   Anesthesia Other Findings S/P Gastric Sleeve Anxiety GERD Colon Screening Previous DM Resolved with Wt loss  Reproductive/Obstetrics negative OB ROS                             Anesthesia Physical Anesthesia Plan  ASA: II  Anesthesia Plan: General   Post-op Pain Management:    Induction:   PONV Risk Score and Plan: Propofol infusion and TIVA  Airway Management Planned: Nasal Cannula  Additional Equipment:   Intra-op Plan:   Post-operative Plan:   Informed Consent: I have reviewed the patients History and Physical, chart, labs and discussed the procedure including the risks, benefits and alternatives for the proposed anesthesia with the patient or authorized representative who has indicated his/her understanding and acceptance.       Plan Discussed with: CRNA, Anesthesiologist and Surgeon  Anesthesia Plan Comments:        Anesthesia Quick Evaluation

## 2020-11-10 NOTE — Op Note (Signed)
[facility] Gastroenterology Patient Name: Jade Medina Procedure Date: 11/10/2020 7:57 AM MRN: [id number] Account #: [id number] Date of Birth: Jan 05, 1971 Admit Type: Outpatient Age: 49 Room: [facility] ENDO ROOM 4 Gender: Female Note Status: Finalized Procedure:             Upper GI endoscopy Indications:           Screening for Barrett's esophagus Providers:             Jonathon Bellows MD, MD Referring MD:          Ardeth Perfect Medicines:             Monitored Anesthesia Care Complications:         No immediate complications. Procedure:             Pre-Anesthesia Assessment:                        - Prior to the procedure, a History and Physical was                         performed, and patient medications, allergies and                         sensitivities were reviewed. The patient's tolerance                         of previous anesthesia was reviewed.                        - The risks and benefits of the procedure and the                         sedation options and risks were discussed with the                         patient. All questions were answered and informed                         consent was obtained.                        - ASA Grade Assessment: II - A patient with mild                         systemic disease.                        After obtaining informed consent, the endoscope was                         passed under direct vision. Throughout the procedure,                         the patient's blood pressure, pulse, and oxygen                         saturations were monitored continuously. The Endoscope                         was introduced through the mouth, and advanced to  the                         third part of duodenum. The upper GI endoscopy was                         accomplished with ease. The patient tolerated the                         procedure well. Findings:      The esophagus was normal.      Evidence of a gastric  bypass was found. A gastric pouch with a medium       size was found. The staple line appeared intact. The gastrojejunal       anastomosis was characterized by healthy appearing mucosa. This was       traversed.      The cardia and gastric fundus were normal on retroflexion.      The stomach was normal.      The examined duodenum was normal. Impression:            - Normal esophagus.                        - Gastric bypass with a medium-sized pouch and intact                         staple line. Gastrojejunal anastomosis characterized                         by healthy appearing mucosa.                        - Normal stomach.                        - Normal examined duodenum.                        - No specimens collected. Recommendation:        - Discharge patient to home (with escort). Procedure Code(s):     --- Professional ---                        [phone], Esophagogastroduodenoscopy, flexible,                         transoral; diagnostic, including collection of                         specimen(s) by brushing or washing, when performed                         (separate procedure) Diagnosis Code(s):     --- Professional ---                        T24.58, Bariatric surgery status                        Z13.810, Encounter for screening for upper  gastrointestinal disorder CPT copyright 2019 American Medical Association. All rights reserved. The codes documented in this report are preliminary and upon coder review may  be revised to meet current compliance requirements. Jonathon Bellows, MD Jonathon Bellows MD, MD 11/10/2020 8:15:44 AM This report has been signed electronically. Number of Addenda: 0 Note Initiated On: 11/10/2020 7:57 AM Estimated Blood Loss:  Estimated blood loss: none.      [facility]

## 2020-11-10 NOTE — H&P (Signed)
Jonathon Bellows, MD [address] [place], [place], [place], [address] [address], [place], [place], [place], [address] Phone: [phone]  Fax: [phone]  Primary Care Physician:  Chad Cordial, PA-C   Pre-Procedure History & Physical: HPI:  Jade Medina is a 49 y.o. female is here for an endoscopy and colonoscopy    Past Medical History:  Diagnosis Date  . Anxiety   . Diabetes type 2, controlled ([place])   . Edema   . GERD (gastroesophageal reflux disease)   . Hyperlipidemia   . Menorrhagia   . Obesity     Past Surgical History:  Procedure Laterality Date  . CESAREAN SECTION  1995  . CHOLECYSTECTOMY  2004  . IUD placement  12/07/2011  . LAPAROSCOPIC GASTRIC BAND REMOVAL WITH LAPAROSCOPIC GASTRIC SLEEVE RESECTION  08/2016  . TOTAL HIP ARTHROPLASTY  2011, 2015  . UPPER GI ENDOSCOPY    . weightloss      Prior to Admission medications   Medication Sig Start Date End Date Taking? Authorizing Provider  pantoprazole (PROTONIX) 40 MG tablet Take 1 tablet (40 mg total) by mouth daily. 5/78/46  Yes Copland, Deirdre Evener, PA-C  phentermine (ADIPEX-P) 37.5 MG tablet Take 1 tablet (37.5 mg total) by mouth daily before breakfast. 9/62/95  Yes Copland, Alicia B, PA-C  sertraline (ZOLOFT) 100 MG tablet Take 1 tablet (100 mg total) by mouth daily. 2/84/13  Yes Copland, Elmo Putt B, PA-C  albuterol (VENTOLIN HFA) 108 (90 Base) MCG/ACT inhaler Inhale into the lungs. 04/21/20   [provider]  levonorgestrel (MIRENA, 52 MG,) 20 MCG/24HR IUD 1 Intra Uterine Device (1 each total) by Intrauterine route once. 02/06/17 2/44/01  Copland, Elmo Putt B, PA-C    Allergies as of 10/24/2020 - Review Complete 10/24/2020  Allergen Reaction Noted  . Morphine Nausea And Vomiting 08/20/2016    Family History  Problem Relation Age of Onset  . Stroke Father   . Hypertension Father     Social History   Socioeconomic History  . Marital status: Married    Spouse name: Not on file  .  Number of children: Not on file  . Years of education: Not on file  . Highest education level: Not on file  Occupational History  . Not on file  Tobacco Use  . Smoking status: Never Smoker  . Smokeless tobacco: Never Used  Vaping Use  . Vaping Use: Never used  Substance and Sexual Activity  . Alcohol use: No  . Drug use: No  . Sexual activity: Yes    Partners: Male    Birth control/protection: I.U.D., Surgical    Comment: Mirena/Vasectomy  Other Topics Concern  . Not on file  Social History Narrative  . Not on file   Social Determinants of Health   Financial Resource Strain: Not on file  Food Insecurity: Not on file  Transportation Needs: Not on file  Physical Activity: Not on file  Stress: Not on file  Social Connections: Not on file  Intimate Partner Violence: Not on file    Review of Systems: See HPI, otherwise negative ROS  Physical Exam: BP (!) 139/101   Pulse 70   Temp (!) 97.2 F (36.2 C) (Temporal)   Resp 16   Ht 5\' 3"  (1.6 m)   Wt 88.5 kg   SpO2 100%   BMI 34.54 kg/m  General:   Alert,  pleasant and cooperative in NAD Head:  Normocephalic and atraumatic. Neck:  Supple; no masses or thyromegaly.  Lungs:  Clear throughout to auscultation, normal respiratory effort.    Heart:  +S1, +S2, Regular rate and rhythm, No edema. Abdomen:  Soft, nontender and nondistended. Normal bowel sounds, without guarding, and without rebound.   Neurologic:  Alert and  oriented x4;  grossly normal neurologically.  Impression/Plan: Jade Medina is here for an endoscopy and colonoscopy  to be performed for  evaluation of screen for barrettes and colon cancer    Risks, benefits, limitations, and alternatives regarding endoscopy have been reviewed with the patient.  Questions have been answered.  All parties agreeable.   Jonathon Bellows, MD  11/10/2020, 8:03 AM \

## 2020-11-10 NOTE — Anesthesia Postprocedure Evaluation (Signed)
Anesthesia Post Note  Patient: Jade Medina  Procedure(s) Performed: COLONOSCOPY WITH PROPOFOL (N/A ) ESOPHAGOGASTRODUODENOSCOPY (EGD) WITH PROPOFOL (N/A )  Patient location during evaluation: Phase II Anesthesia Type: General Level of consciousness: awake and alert, awake and oriented Pain management: pain level controlled Vital Signs Assessment: post-procedure vital signs reviewed and stable Respiratory status: spontaneous breathing, nonlabored ventilation and respiratory function stable Cardiovascular status: blood pressure returned to baseline Postop Assessment: no apparent nausea or vomiting Anesthetic complications: no   No complications documented.   Last Vitals:  Vitals:   11/10/20 0846 11/10/20 0916  BP: 131/87 (!) 167/119  Pulse: 77   Resp: 12   Temp: (!) 36.3 C   SpO2: 100%     Last Pain:  Vitals:   11/10/20 0916  TempSrc:   PainSc: 0-No pain                 Phill Mutter

## 2020-11-14 ENCOUNTER — Encounter: Payer: Self-pay | Admitting: Gastroenterology

## 2020-11-14 ENCOUNTER — Encounter: Payer: Self-pay | Admitting: Obstetrics and Gynecology

## 2020-11-14 LAB — SURGICAL PATHOLOGY

## 2021-01-02 ENCOUNTER — Ambulatory Visit: Payer: BC Managed Care – PPO | Admitting: Gastroenterology

## 2021-02-20 ENCOUNTER — Ambulatory Visit: Payer: BC Managed Care – PPO | Admitting: Gastroenterology

## 2021-05-02 ENCOUNTER — Ambulatory Visit (INDEPENDENT_AMBULATORY_CARE_PROVIDER_SITE_OTHER): Payer: BC Managed Care – PPO | Admitting: Gastroenterology

## 2021-05-02 ENCOUNTER — Encounter: Payer: Self-pay | Admitting: Gastroenterology

## 2021-05-02 ENCOUNTER — Other Ambulatory Visit: Payer: Self-pay

## 2021-05-02 VITALS — BP 135/87 | HR 76 | Temp 98.2°F | Ht 63.0 in | Wt 211.0 lb

## 2021-05-02 DIAGNOSIS — M545 Low back pain, unspecified: Secondary | ICD-10-CM | POA: Insufficient documentation

## 2021-05-02 DIAGNOSIS — Z9049 Acquired absence of other specified parts of digestive tract: Secondary | ICD-10-CM | POA: Insufficient documentation

## 2021-05-02 DIAGNOSIS — N393 Stress incontinence (female) (male): Secondary | ICD-10-CM | POA: Insufficient documentation

## 2021-05-02 DIAGNOSIS — Z86010 Personal history of colonic polyps: Secondary | ICD-10-CM

## 2021-05-02 DIAGNOSIS — Z9889 Other specified postprocedural states: Secondary | ICD-10-CM | POA: Insufficient documentation

## 2021-05-02 DIAGNOSIS — I10 Essential (primary) hypertension: Secondary | ICD-10-CM | POA: Insufficient documentation

## 2021-05-02 DIAGNOSIS — K219 Gastro-esophageal reflux disease without esophagitis: Secondary | ICD-10-CM

## 2021-05-02 DIAGNOSIS — E785 Hyperlipidemia, unspecified: Secondary | ICD-10-CM | POA: Insufficient documentation

## 2021-05-02 DIAGNOSIS — M5020 Other cervical disc displacement, unspecified cervical region: Secondary | ICD-10-CM | POA: Insufficient documentation

## 2021-05-02 DIAGNOSIS — Z9884 Bariatric surgery status: Secondary | ICD-10-CM | POA: Insufficient documentation

## 2021-05-02 DIAGNOSIS — E119 Type 2 diabetes mellitus without complications: Secondary | ICD-10-CM | POA: Insufficient documentation

## 2021-05-02 DIAGNOSIS — Z6841 Body Mass Index (BMI) 40.0 and over, adult: Secondary | ICD-10-CM | POA: Insufficient documentation

## 2021-05-02 NOTE — Progress Notes (Signed)
Jonathon Bellows MD, MRCP(U.K) [address]  [place]  [place], [place] [address]  Main: [phone]  Fax: [phone]   Primary Care Physician: Amalia Greenhouse  Primary Gastroenterologist:  Dr. Jonathon Bellows   Follow-up for GERD   HPI: Jade Medina is a 50 y.o. female   Summary of history :  Initially referred and seen in December 2021 for GERD and colon cancer screening.  History of acid reflux over 15 years with previous history of weight gain.  She has been taking Protonix once at night after dinner for many years.  Significant improvement in symptoms but not complete.  Interval history   10/24/2020-05/02/2021  11/10/2020: Colonoscopy: 5 polyps 4 to 6 mm in size resected from the descending and ascending colon and 3 polyps 4 to 6 mm in size resected in the transverse colon.  The polyps were all adenomas.  Repeat colonoscopy in 3 years upper endoscopy also performed at the same time.  Evidence of gastric bypass surgery noted.  No evidence of Barrett's esophagus noted.  She is doing well since her last visit.  Taking the Protonix 40 mg once in the morning on an empty stomach.  Having breakthrough symptoms once every few weeks usually in the middle of the night.  Current Outpatient Medications  Medication Sig Dispense Refill   levonorgestrel (MIRENA, 52 MG,) 20 MCG/24HR IUD 1 Intra Uterine Device (1 each total) by Intrauterine route once. 1 Intra Uterine Device 0   pantoprazole (PROTONIX) 40 MG tablet Take 1 tablet (40 mg total) by mouth daily. 90 tablet 3   phentermine (ADIPEX-P) 37.5 MG tablet Take 1 tablet (37.5 mg total) by mouth daily before breakfast. 30 tablet 1   sertraline (ZOLOFT) 100 MG tablet Take 1 tablet (100 mg total) by mouth daily. 90 tablet 3   albuterol (VENTOLIN HFA) 108 (90 Base) MCG/ACT inhaler Inhale into the lungs. (Patient not taking: Reported on 05/02/2021)     No current facility-administered medications for this visit.    Allergies as of  05/02/2021 - Review Complete 05/02/2021  Allergen Reaction Noted   Morphine Nausea And Vomiting 08/20/2016    ROS:  General: Negative for anorexia, weight loss, fever, chills, fatigue, weakness. ENT: Negative for hoarseness, difficulty swallowing , nasal congestion. CV: Negative for chest pain, angina, palpitations, dyspnea on exertion, peripheral edema.  Respiratory: Negative for dyspnea at rest, dyspnea on exertion, cough, sputum, wheezing.  GI: See history of present illness. GU:  Negative for dysuria, hematuria, urinary incontinence, urinary frequency, nocturnal urination.  Endo: Negative for unusual weight change.    Physical Examination:   BP 135/87   Pulse 76   Temp 98.2 F (36.8 C) (Oral)   Ht 5\' 3"  (1.6 m)   Wt 211 lb (95.7 kg)   BMI 37.38 kg/m   General: Well-nourished, well-developed in no acute distress.  Eyes: No icterus. Conjunctivae pink. Neuro: Alert and oriented x 3.  Grossly intact. Skin: Warm and dry, no jaundice.   Psych: Alert and cooperative, normal mood and affect.   Imaging Studies: No results found.  Assessment and Plan:   Jade Medina is a 50 y.o. y/o female here to follow-up for GERD.  Occasional breakthrough symptoms  Plan 1.  Continue lifestyle changes.  No evidence of Barrett's esophagus on recent endoscopy.  Discussed about lifestyle changes again.  Suggested to use Tums as needed at nighttime for breakthrough symptoms or if occurring more than 1-2 times a week consider adding famotidine  40 mg at nighttime.  If that too does not work we will have to increase dose of Protonix to 40 mg twice daily.  My aim at this point of time is to use the least amount of medication for the shortest period of time. 2.  Personal history of tubular adenomas greater than 3 on last colonoscopy in December 2021.  Suggest repeat in 3 years time.      Dr Jonathon Bellows  MD,MRCP [facility]) Follow up in as needed

## 2021-06-20 ENCOUNTER — Encounter: Payer: Self-pay | Admitting: Obstetrics and Gynecology

## 2021-07-16 ENCOUNTER — Other Ambulatory Visit: Payer: Self-pay

## 2021-07-16 ENCOUNTER — Emergency Department: Payer: BC Managed Care – PPO

## 2021-07-16 ENCOUNTER — Emergency Department
Admission: EM | Admit: 2021-07-16 | Discharge: 2021-07-16 | Disposition: A | Discharge status: Home / Self Care | Payer: BC Managed Care – PPO | Attending: Emergency Medicine | Admitting: Emergency Medicine

## 2021-07-16 DIAGNOSIS — R0789 Other chest pain: Secondary | ICD-10-CM | POA: Diagnosis present

## 2021-07-16 DIAGNOSIS — I10 Essential (primary) hypertension: Secondary | ICD-10-CM | POA: Diagnosis not present

## 2021-07-16 DIAGNOSIS — K2100 Gastro-esophageal reflux disease with esophagitis, without bleeding: Secondary | ICD-10-CM | POA: Diagnosis not present

## 2021-07-16 DIAGNOSIS — E119 Type 2 diabetes mellitus without complications: Secondary | ICD-10-CM | POA: Insufficient documentation

## 2021-07-16 LAB — BASIC METABOLIC PANEL
Anion gap: 8 (ref 5–15)
BUN: 22 mg/dL — ABNORMAL HIGH (ref 6–20)
CO2: 23 mmol/L (ref 22–32)
Calcium: 8.7 mg/dL — ABNORMAL LOW (ref 8.9–10.3)
Chloride: 106 mmol/L (ref 98–111)
Creatinine, Ser: 0.79 mg/dL (ref 0.44–1.00)
GFR, Estimated: >60 mL/min (ref >60)
Glucose, Bld: 120 mg/dL — ABNORMAL HIGH (ref 70–99)
Potassium: 4.3 mmol/L (ref 3.5–5.1)
Sodium: 137 mmol/L (ref 135–145)

## 2021-07-16 LAB — HEPATIC FUNCTION PANEL
ALT: 14 U/L (ref 0–44)
AST: 18 U/L (ref 15–41)
Albumin: 3.9 g/dL (ref 3.5–5.0)
Alkaline Phosphatase: 55 U/L (ref 38–126)
Bilirubin, Direct: <0.1 mg/dL (ref 0.0–0.2)
Total Bilirubin: 0.9 mg/dL (ref 0.3–1.2)
Total Protein: 7.2 g/dL (ref 6.5–8.1)

## 2021-07-16 LAB — TROPONIN I (HIGH SENSITIVITY)
Troponin I (High Sensitivity): 3 ng/L (ref <18)
Troponin I (High Sensitivity): 6 ng/L (ref <18)

## 2021-07-16 LAB — CBC
HCT: 43.6 % (ref 36.0–46.0)
Hemoglobin: 15.2 g/dL — ABNORMAL HIGH (ref 12.0–15.0)
MCH: 32.2 pg (ref 26.0–34.0)
MCHC: 34.9 g/dL (ref 30.0–36.0)
MCV: 92.4 fL (ref 80.0–100.0)
Platelets: 310 10*3/uL (ref 150–400)
RBC: 4.72 MIL/uL (ref 3.87–5.11)
RDW: 12.7 % (ref 11.5–15.5)
WBC: 8.6 10*3/uL (ref 4.0–10.5)
nRBC: 0 % (ref 0.0–0.2)

## 2021-07-16 LAB — LIPASE, BLOOD: Lipase: 35 U/L (ref 11–51)

## 2021-07-16 MED ORDER — ALUM & MAG HYDROXIDE-SIMETH 200-200-20 MG/5ML PO SUSP
30.0000 mL | Freq: Once | ORAL | Status: AC
  Administered 2021-07-16: 30 mL via ORAL
  Filled 2021-07-16: qty 30

## 2021-07-16 MED ORDER — LIDOCAINE VISCOUS HCL 2 % MT SOLN
15.0000 mL | Freq: Once | OROMUCOSAL | Status: AC
  Administered 2021-07-16: 15 mL via ORAL
  Filled 2021-07-16: qty 15

## 2021-07-16 NOTE — ED Provider Notes (Signed)
[facility] Emergency Department Provider Note ____________________________________________   Event Date/Time   First MD Initiated Contact with Patient 07/16/21 1513     (approximate)  I have reviewed the triage vital signs and the nursing notes.  HISTORY  Chief Complaint Chest Pain   HPI Jade Medina is a 50 y.o. femalewho presents to the ED for evaluation of chest pains and reflux.   Chart review indicates hx obesity, GERD, DM.  No cardiac history.  Patient presents to the ED for evaluation of 2 days of waning chest discomfort and burning.  She reports an episode of regurgitation that she relates to her longstanding history of acid reflux, and developing burning chest pressure at that time, which was 2 days ago.  She reports this chest pressure has been present since then, but slowly improving.   She denies any associated symptoms, shows no shortness of breath, cough, choking, fever, abdominal pain.  Past Medical History:  Diagnosis Date   Anxiety    Colon polyps    Diabetes type 2, controlled ([place])    Edema    GERD (gastroesophageal reflux disease)    Hyperlipidemia    Menorrhagia    Obesity     Patient Active Problem List   Diagnosis Date Noted   History of gastric bypass 05/02/2021   Diabetes type 2, controlled ([place]) 05/02/2021   Displacement of cervical intervertebral disc 05/02/2021   History of cholecystectomy 05/02/2021   History of repair of hip joint 05/02/2021   Hyperlipidemia 05/02/2021   Hypertension 05/02/2021   Low back pain 05/02/2021   Morbid obesity with BMI of 45.0-49.9, adult ([place]) 05/02/2021   SUI (stress urinary incontinence, female) 05/02/2021   Colon cancer screening 10/24/2020   Gastroesophageal reflux disease 08/07/2019   Anxiety 08/07/2019   Neck pain 06/08/2019   Cervical radiculopathy 03/10/2019   Contusion of left knee 12/24/2016   S/P laparoscopic sleeve gastrectomy 09/06/2016    Past Surgical  History:  Procedure Laterality Date   [place]  2004   COLONOSCOPY WITH PROPOFOL N/A 11/10/2020   repeat in 3 yrs; Procedure: COLONOSCOPY WITH PROPOFOL;  Surgeon: Jonathon Bellows, MD;  Location: [facility] ENDOSCOPY;  Service: Gastroenterology;  Laterality: N/A;   ESOPHAGOGASTRODUODENOSCOPY (EGD) WITH PROPOFOL N/A 11/10/2020   Procedure: ESOPHAGOGASTRODUODENOSCOPY (EGD) WITH PROPOFOL;  Surgeon: Jonathon Bellows, MD;  Location: [facility] ENDOSCOPY;  Service: Gastroenterology;  Laterality: N/A;   IUD placement  12/07/2011   LAPAROSCOPIC GASTRIC BAND REMOVAL WITH LAPAROSCOPIC GASTRIC SLEEVE RESECTION  08/2016   TOTAL HIP ARTHROPLASTY  2011, 2015   UPPER GI ENDOSCOPY     weightloss      Prior to Admission medications   Medication Sig Start Date End Date Taking? Authorizing Provider  albuterol (VENTOLIN HFA) 108 (90 Base) MCG/ACT inhaler Inhale into the lungs. Patient not taking: Reported on 05/02/2021 04/21/20   [provider]  levonorgestrel (MIRENA, 52 MG,) 20 MCG/24HR IUD 1 Intra Uterine Device (1 each total) by Intrauterine route once. 02/06/17 [id number]  Copland, Deirdre Evener, PA-C  pantoprazole (PROTONIX) 40 MG tablet Take 1 tablet (40 mg total) by mouth daily. [id number]   Copland, Deirdre Evener, PA-C  phentermine (ADIPEX-P) 37.5 MG tablet Take 1 tablet (37.5 mg total) by mouth daily before breakfast. [id number]   Copland, Elmo Putt B, PA-C  sertraline (ZOLOFT) 100 MG tablet Take 1 tablet (100 mg total) by mouth daily. [id number]   Copland, Deirdre Evener, PA-C    Allergies Morphine  Family History  Problem Relation Age of Onset   Stroke Father    Hypertension Father     Social History Social History   Tobacco Use   Smoking status: Never   Smokeless tobacco: Never  Vaping Use   Vaping Use: Never used  Substance Use Topics   Alcohol use: No   Drug use: No   Review of Systems  Constitutional: No fever/chills Eyes: No visual changes. ENT: No sore throat. Cardiovascular:  Positive for chest pain. Respiratory: Denies shortness of breath. Gastrointestinal: No abdominal pain.  No nausea, no vomiting.  No diarrhea.  No constipation. Genitourinary: Negative for dysuria. Musculoskeletal: Negative for back pain. Skin: Negative for rash. Neurological: Negative for headaches, focal weakness or numbness. ____________________________________________   PHYSICAL EXAM:  VITAL SIGNS: Vitals:   07/16/21 1542 07/16/21 1600  BP:  (!) 151/101  Pulse: 70 64  Resp: 11 18  Temp:    SpO2: 97% 98%    Constitutional: Alert and oriented. Well appearing and in no acute distress. Eyes: Conjunctivae are normal. PERRL. EOMI. Head: Atraumatic. Nose: No congestion/rhinnorhea. Mouth/Throat: Mucous membranes are moist.  Oropharynx non-erythematous. Neck: No stridor. No cervical spine tenderness to palpation. Cardiovascular: Normal rate, regular rhythm. Grossly normal heart sounds.  Good peripheral circulation. Respiratory: Normal respiratory effort.  No retractions. Lungs CTAB. Gastrointestinal: Soft , nondistended. No CVA tenderness. Minimal epigastric discomfort without peritoneal features.  Abdomen is otherwise benign. No RUQ discomfort.  Abdomen otherwise benign. Musculoskeletal: No lower extremity tenderness nor edema.  No joint effusions. No signs of acute trauma. Neurologic:  Normal speech and language. No gross focal neurologic deficits are appreciated. No gait instability noted. Skin:  Skin is warm, dry and intact. No rash noted. Psychiatric: Mood and affect are normal. Speech and behavior are normal. ____________________________________________   LABS (all labs ordered are listed, but only abnormal results are displayed)  Labs Reviewed  BASIC METABOLIC PANEL - Abnormal; Notable for the following components:      Result Value   Glucose, Bld 120 (*)    BUN 22 (*)    Calcium 8.7 (*)    All other components within normal limits  CBC - Abnormal; Notable for the  following components:   Hemoglobin 15.2 (*)    All other components within normal limits  HEPATIC FUNCTION PANEL  LIPASE, BLOOD  POC URINE PREG, ED  TROPONIN I (HIGH SENSITIVITY)  TROPONIN I (HIGH SENSITIVITY)   ____________________________________________  12 Lead EKG  Sinus rhythm, rate of 70 bpm.  Normal axis.  Right bundle.  No evidence of acute ischemia. ____________________________________________  RADIOLOGY  ED MD interpretation: 2 view CXR reviewed by me without evidence of acute cardiopulmonary pathology.  Official radiology report(s): DG Chest 2 View  Result Date: 07/16/2021 CLINICAL DATA:  Chest pain. EXAM: CHEST - 2 VIEW COMPARISON:  July 18, 2014. FINDINGS: The heart size and mediastinal contours are within normal limits. Both lungs are clear. The visualized skeletal structures are unremarkable. IMPRESSION: No active cardiopulmonary disease. Electronically Signed   By: Marijo Conception M.D.   On: 07/16/2021 10:06    ____________________________________________   PROCEDURES and INTERVENTIONS  Procedure(s) performed (including Critical Care):  Procedures  Medications  alum & mag hydroxide-simeth (MAALOX/MYLANTA) 200-200-20 MG/5ML suspension 30 mL (30 mLs Oral Given 07/16/21 1535)    And  lidocaine (XYLOCAINE) 2 % viscous mouth solution 15 mL (15 mLs Oral Given 07/16/21 1535)    ____________________________________________   MDM / ED COURSE   50 year old woman with history of GERD presents  to the ED with burning chest discomfort, likely esophagitis in etiology, and amenable to outpatient management.  Exam with mild epigastric tenderness without peritoneal features, otherwise looks well without evidence of acute pathology.  CXR without infiltrates or PTX.  No evidence of ACS or biliary obstruction.  Anticipate symptomatic esophageal reflux.  Will discharge with return precautions.  Clinical Course as of 07/16/21 1626  Sun Jul 16, 2021  1529 We discussed GI  cocktail, second troponin and LFTs with the expectation of likely outpatient management.  She is in agreement. [DS]  [id number].  She reports some mild improvement after GI cocktail.  We discussed outpatient management and return precautions for the ED. [DS]    Clinical Course User Index [DS] Vladimir Crofts, MD    ____________________________________________   FINAL CLINICAL IMPRESSION(S) / ED DIAGNOSES  Final diagnoses:  Other chest pain  Gastroesophageal reflux disease with esophagitis without hemorrhage     ED Discharge Orders     None        Dahna Hattabaugh   Note:  This document was prepared using Dragon voice recognition software and may include unintentional dictation errors.    Vladimir Crofts, MD 07/16/21 [phone]

## 2021-07-16 NOTE — ED Triage Notes (Signed)
Pt comes pov with chest pressure and reflux. States right now feels like an elephant is sitting on her chest and the past 2 days her reflux has been increased more than usual. Denies hx of HTN or MI.

## 2021-07-17 ENCOUNTER — Encounter: Payer: Self-pay | Admitting: Obstetrics and Gynecology

## 2021-09-19 ENCOUNTER — Other Ambulatory Visit: Payer: Self-pay | Admitting: Obstetrics and Gynecology

## 2021-09-19 DIAGNOSIS — F419 Anxiety disorder, unspecified: Secondary | ICD-10-CM

## 2021-09-20 ENCOUNTER — Ambulatory Visit: Payer: BC Managed Care – PPO | Admitting: Obstetrics and Gynecology

## 2021-09-20 ENCOUNTER — Encounter: Payer: Self-pay | Admitting: Obstetrics and Gynecology

## 2021-09-20 ENCOUNTER — Other Ambulatory Visit (HOSPITAL_COMMUNITY)
Admission: RE | Admit: 2021-09-20 | Discharge: 2021-09-20 | Disposition: A | Discharge status: Home / Self Care | Payer: BC Managed Care – PPO | Source: Ambulatory Visit | Attending: Obstetrics and Gynecology | Admitting: Obstetrics and Gynecology

## 2021-09-20 ENCOUNTER — Other Ambulatory Visit: Payer: Self-pay

## 2021-09-20 ENCOUNTER — Ambulatory Visit (INDEPENDENT_AMBULATORY_CARE_PROVIDER_SITE_OTHER): Payer: BC Managed Care – PPO | Admitting: Obstetrics and Gynecology

## 2021-09-20 VITALS — BP 140/100 | Ht 63.0 in | Wt 216.0 lb

## 2021-09-20 DIAGNOSIS — Z124 Encounter for screening for malignant neoplasm of cervix: Secondary | ICD-10-CM | POA: Diagnosis present

## 2021-09-20 DIAGNOSIS — Z1151 Encounter for screening for human papillomavirus (HPV): Secondary | ICD-10-CM | POA: Diagnosis present

## 2021-09-20 DIAGNOSIS — Z13220 Encounter for screening for lipoid disorders: Secondary | ICD-10-CM

## 2021-09-20 DIAGNOSIS — Z30431 Encounter for routine checking of intrauterine contraceptive device: Secondary | ICD-10-CM

## 2021-09-20 DIAGNOSIS — F419 Anxiety disorder, unspecified: Secondary | ICD-10-CM

## 2021-09-20 DIAGNOSIS — Z01419 Encounter for gynecological examination (general) (routine) without abnormal findings: Secondary | ICD-10-CM | POA: Diagnosis not present

## 2021-09-20 DIAGNOSIS — Z1329 Encounter for screening for other suspected endocrine disorder: Secondary | ICD-10-CM

## 2021-09-20 DIAGNOSIS — Z0000 Encounter for general adult medical examination without abnormal findings: Secondary | ICD-10-CM

## 2021-09-20 DIAGNOSIS — Z1321 Encounter for screening for nutritional disorder: Secondary | ICD-10-CM

## 2021-09-20 DIAGNOSIS — K219 Gastro-esophageal reflux disease without esophagitis: Secondary | ICD-10-CM

## 2021-09-20 DIAGNOSIS — Z1231 Encounter for screening mammogram for malignant neoplasm of breast: Secondary | ICD-10-CM

## 2021-09-20 DIAGNOSIS — Z9884 Bariatric surgery status: Secondary | ICD-10-CM

## 2021-09-20 MED ORDER — SERTRALINE HCL 100 MG PO TABS: 100.0000 mg | ORAL_TABLET | Freq: Every day | ORAL | 3 refills | Status: DC

## 2021-09-20 NOTE — Patient Instructions (Signed)
I value your feedback and you entrusting us with your care. If you get a [facility] patient survey, I would appreciate you taking the time to let us know about your experience today. Thank you!  [facility]: [phone]      

## 2021-09-20 NOTE — Progress Notes (Signed)
PCP:  Chad Cordial, PA-C   Chief Complaint  Patient presents with   Gynecologic Exam    Qs about acid reflux med     HPI:      [name] is a 50 y.o. [id number] who LMP was No LMP recorded. (Menstrual status: IUD)., presents today for her annual examination.  Her menses are absent with IUD, occas light spotting for a few days only every couple of months. Dysmenorrhea none. Mirena REplaced 02/06/17 for menorrhagia.   Sex activity: single partner, contraception - vasectomy and IUD.  Last Pap: December 11, 2016  Results were: no abnormalities /neg HPV DNA  Hx of STDs: none  Last mammogram: 09/12/20  Results were: normal--routine follow-up in 12 months There is no FH of breast cancer. There is no FH of ovarian cancer. The patient does do self-breast exams.  Tobacco use: The patient denies current or previous tobacco use. Alcohol use: none No drug use.  Exercise: mod active  She does get adequate calcium and Vitamin D in her diet. S/P bariatric surg. Due for yearly fasting labs. Normal except for borderline LDL 2019 and 2020, didn't do last yr.   Colonoscopy: 10/2020 with DR. Kiran, repeat after 3 yrs; EGD 12/21 was normal.  Takes pantoprazole for GERD and needs RF. Pt occas wakes with GERD sx a few times a month and very bothersome to her. Tried tums per GI without relief. He suggested she could take BID.   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 Started zoloft 2 yr ago, taking 100 mg now with sx control. May be able to wean off due to job change and caregiver improvement.   Pt trying to lose wt. Thought about doing bariatric surg revision but changed her mind, wants someone locally. Tried phentermine but made her jittery. Considering other wt loss meds.   Past Medical History:  Diagnosis Date   Anxiety    Colon polyps    Diabetes type 2, controlled ([place])    Edema    GERD (gastroesophageal reflux disease)    Hyperlipidemia    Menorrhagia    Obesity      Past Surgical History:  Procedure Laterality Date   CESAREAN SECTION  1995   CHOLECYSTECTOMY  2004   COLONOSCOPY WITH PROPOFOL N/A 11/10/2020   repeat in 3 yrs; Procedure: COLONOSCOPY WITH PROPOFOL;  Surgeon: Jonathon Bellows, MD;  Location: [facility] ENDOSCOPY;  Service: Gastroenterology;  Laterality: N/A;   ESOPHAGOGASTRODUODENOSCOPY (EGD) WITH PROPOFOL N/A 11/10/2020   Procedure: ESOPHAGOGASTRODUODENOSCOPY (EGD) WITH PROPOFOL;  Surgeon: Jonathon Bellows, MD;  Location: [facility] ENDOSCOPY;  Service: Gastroenterology;  Laterality: N/A;   IUD placement  12/07/2011   LAPAROSCOPIC GASTRIC BAND REMOVAL WITH LAPAROSCOPIC GASTRIC SLEEVE RESECTION  08/2016   TOTAL HIP ARTHROPLASTY  2011, 2015   UPPER GI ENDOSCOPY     weightloss      Family History  Problem Relation Age of Onset   Stroke Father    Hypertension Father     Social History   Socioeconomic History   Marital status: Married    Spouse name: Not on file   Number of children: Not on file   Years of education: Not on file   Highest education level: Not on file  Occupational History   Not on file  Tobacco Use   Smoking status: Never   Smokeless tobacco: Never  Vaping Use   Vaping Use: Never used  Substance and Sexual Activity   Alcohol use: No  Drug use: No   Sexual activity: Yes    Partners: Male    Birth control/protection: I.U.D., Surgical    Comment: Mirena/Vasectomy  Other Topics Concern   Not on file  Social History Narrative   Not on file   Social Determinants of Health   Financial Resource Strain: Not on file  Food Insecurity: Not on file  Transportation Needs: Not on file  Physical Activity: Not on file  Stress: Not on file  Social Connections: Not on file  Intimate Partner Violence: Not on file    Current Meds  Medication Sig   [DISCONTINUED] pantoprazole (PROTONIX) 40 MG tablet Take 1 tablet (40 mg total) by mouth daily.   [DISCONTINUED] sertraline (ZOLOFT) 100 MG tablet Take 1 tablet (100 mg total) by  mouth daily.     ROS:  Review of Systems  Constitutional:  Negative for fatigue, fever and unexpected weight change.  Respiratory:  Negative for cough, shortness of breath and wheezing.   Cardiovascular:  Negative for chest pain, palpitations and leg swelling.  Gastrointestinal:  Negative for blood in stool, constipation, diarrhea, nausea and vomiting.  Endocrine: Negative for cold intolerance, heat intolerance and polyuria.  Genitourinary:  Negative for dyspareunia, dysuria, flank pain, frequency, genital sores, hematuria, menstrual problem, pelvic pain, urgency, vaginal bleeding, vaginal discharge and vaginal pain.  Musculoskeletal:  Negative for back pain, joint swelling and myalgias.  Skin:  Negative for rash.  Neurological:  Negative for dizziness, syncope, light-headedness, numbness and headaches.  Hematological:  Negative for adenopathy.  Psychiatric/Behavioral:  Positive for agitation. Negative for confusion, sleep disturbance and suicidal ideas. The patient is not nervous/anxious.     Objective: BP (!) 140/100   Ht 5\' 3"  (1.6 m)   Wt 216 lb (98 kg)   BMI 38.26 kg/m  Up 6# since 9/20 appt BP recheck after exam: 130/70  Physical Exam Constitutional:      Appearance: She is well-developed.  Genitourinary:     Vulva normal.     Right Labia: No rash, tenderness or lesions.    Left Labia: No tenderness, lesions or rash.    No vaginal discharge, erythema or tenderness.      Right Adnexa: not tender and no mass present.    Left Adnexa: not tender and no mass present.    No cervical motion tenderness, friability or polyp.     IUD strings visualized.     Uterus is not enlarged or tender.  Breasts:    Right: No mass, nipple discharge, skin change or tenderness.     Left: No mass, nipple discharge, skin change or tenderness.  Neck:     Thyroid: No thyromegaly.  Cardiovascular:     Rate and Rhythm: Normal rate and regular rhythm.     Heart sounds: Normal heart sounds. No  murmur heard. Pulmonary:     Effort: Pulmonary effort is normal.     Breath sounds: Normal breath sounds.  Abdominal:     Palpations: Abdomen is soft.     Tenderness: There is no abdominal tenderness. There is no guarding or rebound.  Musculoskeletal:        General: Normal range of motion.     Cervical back: Normal range of motion.  Lymphadenopathy:     Cervical: No cervical adenopathy.  Neurological:     General: No focal deficit present.     Mental Status: She is alert and oriented to person, place, and time.     Cranial Nerves: No cranial nerve deficit.  Skin:    General: Skin is warm and dry.  Psychiatric:        Mood and Affect: Mood normal.        Behavior: Behavior normal.        Thought Content: Thought content normal.        Judgment: Judgment normal.  Vitals reviewed.    Assessment/Plan:  Encounter for annual routine gynecological examination  Cervical cancer screening - Plan: Cytology - PAP  Screening for HPV (human papillomavirus) - Plan: Cytology - PAP  Encounter for routine checking of intrauterine contraceptive device (IUD)--IUD has 8 yr indication now, strings in cx os  Encounter for screening mammogram for malignant neoplasm of breast - Plan: MM 3D SCREEN BREAST BILATERAL; pt to sched mammo  Gastroesophageal reflux disease, unspecified whether esophagitis present - Plan: pantoprazole (PROTONIX) 40 MG tablet; Rx RF protonix. Can take BID prn. Also wt loss will help with sx.   Anxiety - Plan: sertraline (ZOLOFT) 100 MG tablet, pt to take 1/2 to 1 tab daily. Discussed weaning off. F/u prn.   Blood tests for routine general physical examination - Plan: Comprehensive metabolic panel, Lipid panel, Hemoglobin A1c, TSH, B12, Folate  H/O bariatric surgery - Plan: Comprehensive metabolic panel, Lipid panel, Hemoglobin A1c, TSH, B12, Folate; check labs. Pt to f/u with [facility] re: revision/wt loss options. No longer being followed with MD in [facility], wants someone local. Can sched appt with [facility] MD for wt loss counseling prn.   Screening cholesterol level - Plan: Lipid panel  Thyroid disorder screening - Plan: TSH  Encounter for vitamin deficiency screening - Plan: B12, Folate   Meds ordered this encounter  Medications   DISCONTD: sertraline (ZOLOFT) 100 MG tablet    Sig: Take 1 tablet (100 mg total) by mouth daily.    Dispense:  90 tablet    Refill:  3    Order Specific Question:   Supervising Provider    Answer:   Gae Dry [[id number]]   sertraline (ZOLOFT) 100 MG tablet    Sig: Take 1/2 to 1 tab daily    Dispense:  90 tablet    Refill:  3    Order Specific Question:   Supervising Provider    Answer:   Gae Dry [[id number]]   pantoprazole (PROTONIX) 40 MG tablet    Sig: Take 40 mg QD to BID    Dispense:  180 tablet    Refill:  2    Order Specific Question:   Supervising Provider    Answer:   Gae Dry [[id number]]              GYN counsel breast self exam, mammography screening, adequate intake of calcium and vitamin D, diet and exercise     F/U  Return in about 1 year (around 09/20/2022).  Jade Simmering B. Lenell Mcconnell, PA-C 09/21/2021 9:38 AM

## 2021-09-21 MED ORDER — SERTRALINE HCL 100 MG PO TABS: ORAL_TABLET | ORAL | 3 refills | Status: DC

## 2021-09-21 MED ORDER — PANTOPRAZOLE SODIUM 40 MG PO TBEC
DELAYED_RELEASE_TABLET | ORAL | 2 refills | Status: DC
Start: 2021-09-21 — End: 2022-09-25

## 2021-09-22 DIAGNOSIS — Z23 Encounter for immunization: Secondary | ICD-10-CM | POA: Diagnosis not present

## 2021-09-28 LAB — CYTOLOGY - PAP
Comment: NEGATIVE
Diagnosis: UNDETERMINED — AB
High risk HPV: NEGATIVE

## 2021-09-29 ENCOUNTER — Other Ambulatory Visit: Payer: BC Managed Care – PPO

## 2021-09-29 ENCOUNTER — Other Ambulatory Visit: Payer: Self-pay

## 2021-09-29 DIAGNOSIS — Z9884 Bariatric surgery status: Secondary | ICD-10-CM

## 2021-09-29 DIAGNOSIS — Z0000 Encounter for general adult medical examination without abnormal findings: Secondary | ICD-10-CM

## 2021-09-29 DIAGNOSIS — Z13220 Encounter for screening for lipoid disorders: Secondary | ICD-10-CM | POA: Diagnosis not present

## 2021-09-29 DIAGNOSIS — Z1321 Encounter for screening for nutritional disorder: Secondary | ICD-10-CM

## 2021-09-29 DIAGNOSIS — Z1329 Encounter for screening for other suspected endocrine disorder: Secondary | ICD-10-CM | POA: Diagnosis not present

## 2021-09-29 DIAGNOSIS — E875 Hyperkalemia: Secondary | ICD-10-CM

## 2021-10-04 LAB — COMPREHENSIVE METABOLIC PANEL
ALT: 12 IU/L (ref 0–32)
AST: 15 IU/L (ref 0–40)
Albumin/Globulin Ratio: 1.6 (ref 1.2–2.2)
Albumin: 4.1 g/dL (ref 3.8–4.8)
Alkaline Phosphatase: 66 IU/L (ref 44–121)
BUN/Creatinine Ratio: 21 (ref 9–23)
BUN: 17 mg/dL (ref 6–24)
Bilirubin Total: 0.5 mg/dL (ref 0.0–1.2)
CO2: 24 mmol/L (ref 20–29)
Calcium: 9.7 mg/dL (ref 8.7–10.2)
Chloride: 103 mmol/L (ref 96–106)
Creatinine, Ser: 0.82 mg/dL (ref 0.57–1.00)
Globulin, Total: 2.6 g/dL (ref 1.5–4.5)
Glucose: 106 mg/dL — ABNORMAL HIGH (ref 70–99)
Potassium: 5.6 mmol/L — ABNORMAL HIGH (ref 3.5–5.2)
Sodium: 143 mmol/L (ref 134–144)
Total Protein: 6.7 g/dL (ref 6.0–8.5)
eGFR: 88 mL/min/{1.73_m2} (ref >59)

## 2021-10-04 LAB — HEMOGLOBIN A1C
Est. average glucose Bld gHb Est-mCnc: 117 mg/dL
Hgb A1c MFr Bld: 5.7 % — ABNORMAL HIGH (ref 4.8–5.6)

## 2021-10-04 LAB — LIPID PANEL
Chol/HDL Ratio: 4.4 ratio (ref 0.0–4.4)
Cholesterol, Total: 207 mg/dL — ABNORMAL HIGH (ref 100–199)
HDL: 47 mg/dL (ref >39)
LDL Chol Calc (NIH): 136 mg/dL — ABNORMAL HIGH (ref 0–99)
Triglycerides: 135 mg/dL (ref 0–149)
VLDL Cholesterol Cal: 24 mg/dL (ref 5–40)

## 2021-10-04 LAB — FOLATE: Folate: 7.9 ng/mL (ref >3.0)

## 2021-10-04 LAB — VITAMIN B12: Vitamin B-12: 542 pg/mL (ref 232–1245)

## 2021-10-04 LAB — TSH: TSH: 3.88 u[IU]/mL (ref 0.450–4.500)

## 2021-10-13 ENCOUNTER — Encounter: Payer: Self-pay | Admitting: Obstetrics and Gynecology

## 2021-10-16 ENCOUNTER — Other Ambulatory Visit: Payer: BC Managed Care – PPO

## 2021-10-16 ENCOUNTER — Other Ambulatory Visit: Payer: Self-pay

## 2021-10-16 ENCOUNTER — Ambulatory Visit
Admission: RE | Admit: 2021-10-16 | Discharge: 2021-10-16 | Disposition: A | Discharge status: Home / Self Care | Payer: BC Managed Care – PPO | Source: Ambulatory Visit | Attending: Obstetrics and Gynecology | Admitting: Obstetrics and Gynecology

## 2021-10-16 DIAGNOSIS — Z1231 Encounter for screening mammogram for malignant neoplasm of breast: Secondary | ICD-10-CM | POA: Diagnosis not present

## 2021-10-17 LAB — COMPREHENSIVE METABOLIC PANEL
ALT: 21 IU/L (ref 0–32)
AST: 25 IU/L (ref 0–40)
Albumin/Globulin Ratio: 1.9 (ref 1.2–2.2)
Albumin: 4.2 g/dL (ref 3.8–4.8)
Alkaline Phosphatase: 62 IU/L (ref 44–121)
BUN/Creatinine Ratio: 20 (ref 9–23)
BUN: 17 mg/dL (ref 6–24)
Bilirubin Total: 0.3 mg/dL (ref 0.0–1.2)
CO2: 20 mmol/L (ref 20–29)
Calcium: 8.7 mg/dL (ref 8.7–10.2)
Chloride: 104 mmol/L (ref 96–106)
Creatinine, Ser: 0.83 mg/dL (ref 0.57–1.00)
Globulin, Total: 2.2 g/dL (ref 1.5–4.5)
Glucose: 95 mg/dL (ref 70–99)
Potassium: 4 mmol/L (ref 3.5–5.2)
Sodium: 138 mmol/L (ref 134–144)
Total Protein: 6.4 g/dL (ref 6.0–8.5)
eGFR: 87 mL/min/{1.73_m2} (ref >59)

## 2022-01-23 ENCOUNTER — Encounter: Payer: Self-pay | Admitting: Obstetrics and Gynecology

## 2022-07-09 ENCOUNTER — Other Ambulatory Visit: Payer: Self-pay | Admitting: Obstetrics and Gynecology

## 2022-07-09 DIAGNOSIS — F419 Anxiety disorder, unspecified: Secondary | ICD-10-CM

## 2022-07-13 ENCOUNTER — Other Ambulatory Visit: Payer: Self-pay | Admitting: Obstetrics and Gynecology

## 2022-07-13 DIAGNOSIS — K219 Gastro-esophageal reflux disease without esophagitis: Secondary | ICD-10-CM

## 2022-09-23 DIAGNOSIS — R87610 Atypical squamous cells of undetermined significance on cytologic smear of cervix (ASC-US): Secondary | ICD-10-CM | POA: Insufficient documentation

## 2022-09-23 NOTE — Progress Notes (Unsigned)
PCP:  Chad Cordial, PA-C   No chief complaint on file.    HPI:      [name] is a 51 y.o. [id number] who LMP was No LMP recorded. (Menstrual status: IUD)., presents today for her annual examination.  Her menses are absent with IUD, occas light spotting for a few days only every couple of months. Dysmenorrhea none. Mirena REplaced 02/06/17 for menorrhagia.   Sex activity: single partner, contraception - vasectomy and IUD.  Last Pap: 09/20/21  Results were: ASCUS/neg HPV DNA ; repeat pap today Hx of STDs: none  Last mammogram: 10/16/21  Results were: normal--routine follow-up in 12 months There is no FH of breast cancer. There is no FH of ovarian cancer. The patient does do self-breast exams.  Tobacco use: The patient denies current or previous tobacco use. Alcohol use: none No drug use.  Exercise: mod active  She does get adequate calcium and Vitamin D in her diet. S/P bariatric surg. Due for yearly fasting labs. Normal except for borderline LDL 2019 and 2020, didn't do last yr.   Colonoscopy: 10/2020 with Dr. Vicente Males, repeat after 3 yrs; EGD 12/21 was normal.  Takes pantoprazole for GERD and needs RF. Pt occas wakes with GERD sx a few times a month and very bothersome to her. Tried tums per GI without relief. He suggested she could take BID.   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 Started zoloft 2 yr ago, taking 100 mg now with sx control. May be able to wean off due to job change and caregiver improvement.   Pt trying to lose wt. Thought about doing bariatric surg revision but changed her mind, wants someone locally. Tried phentermine but made her jittery. Considering other wt loss meds.   Labs done 1/23  Past Medical History:  Diagnosis Date   Anxiety    Colon polyps    Diabetes type 2, controlled ([place])    Edema    GERD (gastroesophageal reflux disease)    Hyperlipidemia    Menorrhagia    Obesity     Past Surgical History:  Procedure Laterality  Date   CESAREAN SECTION  1995   CHOLECYSTECTOMY  2004   COLONOSCOPY WITH PROPOFOL N/A 11/10/2020   repeat in 3 yrs; Procedure: COLONOSCOPY WITH PROPOFOL;  Surgeon: Jonathon Bellows, MD;  Location: [facility] ENDOSCOPY;  Service: Gastroenterology;  Laterality: N/A;   ESOPHAGOGASTRODUODENOSCOPY (EGD) WITH PROPOFOL N/A 11/10/2020   Procedure: ESOPHAGOGASTRODUODENOSCOPY (EGD) WITH PROPOFOL;  Surgeon: Jonathon Bellows, MD;  Location: [facility] ENDOSCOPY;  Service: Gastroenterology;  Laterality: N/A;   IUD placement  12/07/2011   LAPAROSCOPIC GASTRIC BAND REMOVAL WITH LAPAROSCOPIC GASTRIC SLEEVE RESECTION  08/2016   TOTAL HIP ARTHROPLASTY  2011, 2015   UPPER GI ENDOSCOPY     weightloss      Family History  Problem Relation Age of Onset   Stroke Father    Hypertension Father    Breast cancer Neg Hx     Social History   Socioeconomic History   Marital status: Married    Spouse name: Not on file   Number of children: Not on file   Years of education: Not on file   Highest education level: Not on file  Occupational History   Not on file  Tobacco Use   Smoking status: Never   Smokeless tobacco: Never  Vaping Use   Vaping Use: Never used  Substance and Sexual Activity   Alcohol use: No   Drug use: No  Sexual activity: Yes    Partners: Male    Birth control/protection: I.U.D., Surgical    Comment: Mirena/Vasectomy  Other Topics Concern   Not on file  Social History Narrative   Not on file   Social Determinants of Health   Financial Resource Strain: Not on file  Food Insecurity: Not on file  Transportation Needs: Not on file  Physical Activity: Insufficiently Active (12/18/2017)   Exercise Vital Sign    Days of Exercise per Week: 1 day    Minutes of Exercise per Session: 30 min  Stress: Stress Concern Present (12/18/2017)   [place]    Feeling of Stress : Rather much  Social Connections: Moderately Integrated (12/18/2017)    Social Connection and Isolation Panel [NHANES]    Frequency of Communication with Friends and Family: More than three times a week    Frequency of Social Gatherings with Friends and Family: Twice a week    Attends Religious Services: More than 4 times per year    Active Member of [employer] or Organizations: No    Attends Archivist Meetings: Never    Marital Status: Married  Human resources officer Violence: Not At Risk (12/18/2017)   Humiliation, Afraid, Rape, and Kick questionnaire    Fear of Current or Ex-Partner: No    Emotionally Abused: No    Physically Abused: No    Sexually Abused: No    No outpatient medications have been marked as taking for the 09/25/22 encounter (Appointment) with Makaio Mach, Elmo Putt B, PA-C.     ROS:  Review of Systems  Constitutional:  Negative for fatigue, fever and unexpected weight change.  Respiratory:  Negative for cough, shortness of breath and wheezing.   Cardiovascular:  Negative for chest pain, palpitations and leg swelling.  Gastrointestinal:  Negative for blood in stool, constipation, diarrhea, nausea and vomiting.  Endocrine: Negative for cold intolerance, heat intolerance and polyuria.  Genitourinary:  Negative for dyspareunia, dysuria, flank pain, frequency, genital sores, hematuria, menstrual problem, pelvic pain, urgency, vaginal bleeding, vaginal discharge and vaginal pain.  Musculoskeletal:  Negative for back pain, joint swelling and myalgias.  Skin:  Negative for rash.  Neurological:  Negative for dizziness, syncope, light-headedness, numbness and headaches.  Hematological:  Negative for adenopathy.  Psychiatric/Behavioral:  Positive for agitation. Negative for confusion, sleep disturbance and suicidal ideas. The patient is not nervous/anxious.      Objective: There were no vitals taken for this visit. Up 6# since 9/20 appt BP recheck after exam: 130/70  Physical Exam Constitutional:      Appearance: She is well-developed.   Genitourinary:     Vulva normal.     Right Labia: No rash, tenderness or lesions.    Left Labia: No tenderness, lesions or rash.    No vaginal discharge, erythema or tenderness.      Right Adnexa: not tender and no mass present.    Left Adnexa: not tender and no mass present.    No cervical motion tenderness, friability or polyp.     IUD strings visualized.     Uterus is not enlarged or tender.  Breasts:    Right: No mass, nipple discharge, skin change or tenderness.     Left: No mass, nipple discharge, skin change or tenderness.  Neck:     Thyroid: No thyromegaly.  Cardiovascular:     Rate and Rhythm: Normal rate and regular rhythm.     Heart sounds: Normal heart sounds. No murmur heard.  Pulmonary:     Effort: Pulmonary effort is normal.     Breath sounds: Normal breath sounds.  Abdominal:     Palpations: Abdomen is soft.     Tenderness: There is no abdominal tenderness. There is no guarding or rebound.  Musculoskeletal:        General: Normal range of motion.     Cervical back: Normal range of motion.  Lymphadenopathy:     Cervical: No cervical adenopathy.  Neurological:     General: No focal deficit present.     Mental Status: She is alert and oriented to person, place, and time.     Cranial Nerves: No cranial nerve deficit.  Skin:    General: Skin is warm and dry.  Psychiatric:        Mood and Affect: Mood normal.        Behavior: Behavior normal.        Thought Content: Thought content normal.        Judgment: Judgment normal.  Vitals reviewed.     Assessment/Plan:  Encounter for annual routine gynecological examination  Cervical cancer screening - Plan: Cytology - PAP  Screening for HPV (human papillomavirus) - Plan: Cytology - PAP  Encounter for routine checking of intrauterine contraceptive device (IUD)--IUD has 8 yr indication now, strings in cx os  Encounter for screening mammogram for malignant neoplasm of breast - Plan: MM 3D SCREEN BREAST  BILATERAL; pt to sched mammo  Gastroesophageal reflux disease, unspecified whether esophagitis present - Plan: pantoprazole (PROTONIX) 40 MG tablet; Rx RF protonix. Can take BID prn. Also wt loss will help with sx.   Anxiety - Plan: sertraline (ZOLOFT) 100 MG tablet, pt to take 1/2 to 1 tab daily. Discussed weaning off. F/u prn.   Blood tests for routine general physical examination - Plan: Comprehensive metabolic panel, Lipid panel, Hemoglobin A1c, TSH, B12, Folate  H/O bariatric surgery - Plan: Comprehensive metabolic panel, Lipid panel, Hemoglobin A1c, TSH, B12, Folate; check labs. Pt to f/u with [facility] re: revision/wt loss options. No longer being followed with MD in [facility], wants someone local. Can sched appt with [facility] MD for wt loss counseling prn.   Screening cholesterol level - Plan: Lipid panel  Thyroid disorder screening - Plan: TSH  Encounter for vitamin deficiency screening - Plan: B12, Folate   No orders of the defined types were placed in this encounter.             GYN counsel breast self exam, mammography screening, adequate intake of calcium and vitamin D, diet and exercise     F/U  No follow-ups on file.  Gwendolyn Nishi B. Attikus Bartoszek, PA-C 09/23/2022 9:40 PM

## 2022-09-25 ENCOUNTER — Encounter: Payer: Self-pay | Admitting: Obstetrics and Gynecology

## 2022-09-25 ENCOUNTER — Other Ambulatory Visit (HOSPITAL_COMMUNITY)
Admission: RE | Admit: 2022-09-25 | Discharge: 2022-09-25 | Disposition: A | Discharge status: Home / Self Care | Payer: BC Managed Care – PPO | Source: Ambulatory Visit | Attending: Obstetrics and Gynecology | Admitting: Obstetrics and Gynecology

## 2022-09-25 ENCOUNTER — Ambulatory Visit (INDEPENDENT_AMBULATORY_CARE_PROVIDER_SITE_OTHER): Payer: BC Managed Care – PPO | Admitting: Obstetrics and Gynecology

## 2022-09-25 VITALS — BP 144/84 | Ht 63.0 in | Wt 213.0 lb

## 2022-09-25 DIAGNOSIS — Z30431 Encounter for routine checking of intrauterine contraceptive device: Secondary | ICD-10-CM

## 2022-09-25 DIAGNOSIS — Z124 Encounter for screening for malignant neoplasm of cervix: Secondary | ICD-10-CM

## 2022-09-25 DIAGNOSIS — R87610 Atypical squamous cells of undetermined significance on cytologic smear of cervix (ASC-US): Secondary | ICD-10-CM

## 2022-09-25 DIAGNOSIS — K219 Gastro-esophageal reflux disease without esophagitis: Secondary | ICD-10-CM

## 2022-09-25 DIAGNOSIS — N921 Excessive and frequent menstruation with irregular cycle: Secondary | ICD-10-CM | POA: Diagnosis not present

## 2022-09-25 DIAGNOSIS — F419 Anxiety disorder, unspecified: Secondary | ICD-10-CM

## 2022-09-25 DIAGNOSIS — Z1151 Encounter for screening for human papillomavirus (HPV): Secondary | ICD-10-CM

## 2022-09-25 DIAGNOSIS — Z9884 Bariatric surgery status: Secondary | ICD-10-CM

## 2022-09-25 DIAGNOSIS — Z01419 Encounter for gynecological examination (general) (routine) without abnormal findings: Secondary | ICD-10-CM | POA: Diagnosis not present

## 2022-09-25 DIAGNOSIS — Z1231 Encounter for screening mammogram for malignant neoplasm of breast: Secondary | ICD-10-CM

## 2022-09-25 MED ORDER — SERTRALINE HCL 100 MG PO TABS: ORAL_TABLET | ORAL | 3 refills | Status: DC

## 2022-09-25 MED ORDER — PANTOPRAZOLE SODIUM 40 MG PO TBEC: DELAYED_RELEASE_TABLET | ORAL | 3 refills | Status: AC

## 2022-09-25 NOTE — Patient Instructions (Signed)
I value your feedback and you entrusting us with your care. If you get a [facility] patient survey, I would appreciate you taking the time to let us know about your experience today. Thank you!  [facility]: [phone]      

## 2022-09-28 LAB — CYTOLOGY - PAP
Comment: NEGATIVE
Diagnosis: NEGATIVE
High risk HPV: NEGATIVE

## 2023-03-01 DIAGNOSIS — R519 Headache, unspecified: Secondary | ICD-10-CM | POA: Diagnosis not present

## 2023-03-01 DIAGNOSIS — Z79899 Other long term (current) drug therapy: Secondary | ICD-10-CM | POA: Diagnosis not present

## 2023-03-01 DIAGNOSIS — F419 Anxiety disorder, unspecified: Secondary | ICD-10-CM | POA: Diagnosis not present

## 2023-03-01 DIAGNOSIS — E119 Type 2 diabetes mellitus without complications: Secondary | ICD-10-CM | POA: Diagnosis not present

## 2023-03-01 DIAGNOSIS — I10 Essential (primary) hypertension: Secondary | ICD-10-CM | POA: Diagnosis not present

## 2023-03-01 DIAGNOSIS — Z6841 Body Mass Index (BMI) 40.0 and over, adult: Secondary | ICD-10-CM | POA: Diagnosis not present

## 2023-03-01 DIAGNOSIS — E785 Hyperlipidemia, unspecified: Secondary | ICD-10-CM | POA: Diagnosis not present

## 2023-03-01 DIAGNOSIS — R7982 Elevated C-reactive protein (CRP): Secondary | ICD-10-CM | POA: Diagnosis not present

## 2023-03-01 DIAGNOSIS — R21 Rash and other nonspecific skin eruption: Secondary | ICD-10-CM | POA: Diagnosis not present

## 2023-03-01 DIAGNOSIS — R030 Elevated blood-pressure reading, without diagnosis of hypertension: Secondary | ICD-10-CM | POA: Diagnosis not present

## 2023-03-05 DIAGNOSIS — R059 Cough, unspecified: Secondary | ICD-10-CM | POA: Diagnosis not present

## 2023-03-06 ENCOUNTER — Ambulatory Visit
Admission: RE | Admit: 2023-03-06 | Discharge: 2023-03-06 | Disposition: A | Discharge status: Home / Self Care | Payer: BC Managed Care – PPO | Source: Ambulatory Visit | Attending: Obstetrics and Gynecology | Admitting: Obstetrics and Gynecology

## 2023-03-06 DIAGNOSIS — Z1231 Encounter for screening mammogram for malignant neoplasm of breast: Secondary | ICD-10-CM | POA: Insufficient documentation

## 2023-03-08 DIAGNOSIS — L720 Epidermal cyst: Secondary | ICD-10-CM | POA: Diagnosis not present

## 2023-03-08 DIAGNOSIS — L814 Other melanin hyperpigmentation: Secondary | ICD-10-CM | POA: Diagnosis not present

## 2023-03-08 DIAGNOSIS — L820 Inflamed seborrheic keratosis: Secondary | ICD-10-CM | POA: Diagnosis not present

## 2023-03-08 DIAGNOSIS — D1801 Hemangioma of skin and subcutaneous tissue: Secondary | ICD-10-CM | POA: Diagnosis not present

## 2023-03-08 DIAGNOSIS — L853 Xerosis cutis: Secondary | ICD-10-CM | POA: Diagnosis not present

## 2023-03-08 DIAGNOSIS — L309 Dermatitis, unspecified: Secondary | ICD-10-CM | POA: Diagnosis not present

## 2023-03-13 DIAGNOSIS — D72829 Elevated white blood cell count, unspecified: Secondary | ICD-10-CM | POA: Diagnosis not present

## 2023-03-21 ENCOUNTER — Other Ambulatory Visit: Payer: Self-pay | Admitting: Adult Health

## 2023-03-21 DIAGNOSIS — I10 Essential (primary) hypertension: Secondary | ICD-10-CM

## 2023-03-21 DIAGNOSIS — R000 Tachycardia, unspecified: Secondary | ICD-10-CM

## 2023-03-21 DIAGNOSIS — R030 Elevated blood-pressure reading, without diagnosis of hypertension: Secondary | ICD-10-CM

## 2023-03-22 DIAGNOSIS — D72829 Elevated white blood cell count, unspecified: Secondary | ICD-10-CM | POA: Diagnosis not present

## 2023-03-22 DIAGNOSIS — L209 Atopic dermatitis, unspecified: Secondary | ICD-10-CM | POA: Diagnosis not present

## 2023-03-22 DIAGNOSIS — E119 Type 2 diabetes mellitus without complications: Secondary | ICD-10-CM | POA: Diagnosis not present

## 2023-03-22 DIAGNOSIS — R21 Rash and other nonspecific skin eruption: Secondary | ICD-10-CM | POA: Diagnosis not present

## 2023-03-22 DIAGNOSIS — I10 Essential (primary) hypertension: Secondary | ICD-10-CM | POA: Diagnosis not present

## 2023-03-26 ENCOUNTER — Ambulatory Visit
Admission: RE | Admit: 2023-03-26 | Discharge: 2023-03-26 | Disposition: A | Discharge status: Home / Self Care | Payer: BC Managed Care – PPO | Source: Ambulatory Visit | Attending: Adult Health | Admitting: Adult Health

## 2023-03-26 DIAGNOSIS — R000 Tachycardia, unspecified: Secondary | ICD-10-CM | POA: Diagnosis not present

## 2023-03-26 DIAGNOSIS — R0602 Shortness of breath: Secondary | ICD-10-CM | POA: Diagnosis not present

## 2023-03-26 DIAGNOSIS — I10 Essential (primary) hypertension: Secondary | ICD-10-CM | POA: Insufficient documentation

## 2023-03-26 DIAGNOSIS — R079 Chest pain, unspecified: Secondary | ICD-10-CM | POA: Diagnosis not present

## 2023-03-26 DIAGNOSIS — R030 Elevated blood-pressure reading, without diagnosis of hypertension: Secondary | ICD-10-CM | POA: Diagnosis not present

## 2023-03-26 MED ORDER — IOHEXOL 350 MG/ML SOLN
100.0000 mL | Freq: Once | INTRAVENOUS | Status: AC | PRN
  Administered 2023-03-26: 80 mL via INTRAVENOUS

## 2023-03-28 ENCOUNTER — Other Ambulatory Visit: Payer: Self-pay | Admitting: Obstetrics and Gynecology

## 2023-03-28 DIAGNOSIS — K219 Gastro-esophageal reflux disease without esophagitis: Secondary | ICD-10-CM

## 2023-04-04 DIAGNOSIS — R7989 Other specified abnormal findings of blood chemistry: Secondary | ICD-10-CM | POA: Diagnosis not present

## 2023-04-04 DIAGNOSIS — E119 Type 2 diabetes mellitus without complications: Secondary | ICD-10-CM | POA: Diagnosis not present

## 2023-04-04 DIAGNOSIS — D72829 Elevated white blood cell count, unspecified: Secondary | ICD-10-CM | POA: Diagnosis not present

## 2023-04-04 DIAGNOSIS — I10 Essential (primary) hypertension: Secondary | ICD-10-CM | POA: Diagnosis not present

## 2023-04-04 DIAGNOSIS — Z1389 Encounter for screening for other disorder: Secondary | ICD-10-CM | POA: Diagnosis not present

## 2023-05-16 NOTE — Progress Notes (Deleted)
Cardiology Office Note:   Date:  05/16/2023  NAME:  Jade Medina    MRN: [id number] DOB:  [birth year]   PCP:  Rica Records, PA-C  Cardiologist:  None  Electrophysiologist:  None   Referring MD: Stephanie Acre*   No chief complaint on file.   History of Present Illness:   Jade Medina is a 52 y.o. female with a hx of obesity, HTN who is being seen today for the evaluation of SOB at the request of Flinchum, Eula Fried, F*.  ***  CRP 40 ESR 18 HGB 16 A1c 5.6 TSH 2.7  Problem List HTN   Past Medical History: Past Medical History:  Diagnosis Date   Anxiety    Colon polyps    Diabetes type 2, controlled ([facility])    Edema    GERD (gastroesophageal reflux disease)    Hyperlipidemia    Menorrhagia    Obesity     Past Surgical History: Past Surgical History:  Procedure Laterality Date   CESAREAN SECTION  1995   CHOLECYSTECTOMY  2004   COLONOSCOPY WITH PROPOFOL N/A 11/10/2020   repeat in 3 yrs; Procedure: COLONOSCOPY WITH PROPOFOL;  Surgeon: [name], MD;  Location: [facility] ENDOSCOPY;  Service: Gastroenterology;  Laterality: N/A;   ESOPHAGOGASTRODUODENOSCOPY (EGD) WITH PROPOFOL N/A 11/10/2020   Procedure: ESOPHAGOGASTRODUODENOSCOPY (EGD) WITH PROPOFOL;  Surgeon: [name], MD;  Location: [facility] ENDOSCOPY;  Service: Gastroenterology;  Laterality: N/A;   IUD placement  12/07/2011   LAPAROSCOPIC GASTRIC BAND REMOVAL WITH LAPAROSCOPIC GASTRIC SLEEVE RESECTION  08/2016   TOTAL HIP ARTHROPLASTY  2011, 2015   UPPER GI ENDOSCOPY     weightloss      Current Medications: No outpatient medications have been marked as taking for the 05/17/23 encounter (Appointment) with O'Neal, Ronnald Ramp, MD.     Allergies:    Morphine   Social History: Social History   Socioeconomic History   Marital status: Married    Spouse name: Not on file   Number of children: Not on file   Years of education: Not on file   Highest education level: Not on file  Occupational  History   Not on file  Tobacco Use   Smoking status: Never   Smokeless tobacco: Never  Vaping Use   Vaping Use: Never used  Substance and Sexual Activity   Alcohol use: No   Drug use: No   Sexual activity: Yes    Partners: Male    Birth control/protection: I.U.D., Surgical    Comment: Mirena/Vasectomy  Other Topics Concern   Not on file  Social History Narrative   Not on file   Social Determinants of Health   Financial Resource Strain: Not on file  Food Insecurity: Not on file  Transportation Needs: Not on file  Physical Activity: Insufficiently Active (12/18/2017)   Exercise Vital Sign    Days of Exercise per Week: 1 day    Minutes of Exercise per Session: 30 min  Stress: Stress Concern Present (12/18/2017)   [employer] of Occupational Health - Occupational Stress Questionnaire    Feeling of Stress : Rather much  Social Connections: Moderately Integrated (12/18/2017)   Social Connection and Isolation Panel [NHANES]    Frequency of Communication with Friends and Family: More than three times a week    Frequency of Social Gatherings with Friends and Family: Twice a week    Attends Religious Services: More than 4 times per year    Active Member of Clubs or Organizations: No  Attends Banker Meetings: Never    Marital Status: Married     Family History: The patient's family history includes Hypertension in her father; Stroke in her father. There is no history of Breast cancer.  ROS:   All other ROS reviewed and negative. Pertinent positives noted in the HPI.     EKGs/Labs/Other Studies Reviewed:   The following studies were personally reviewed by me today:  EKG:  EKG is *** ordered today.        Renal Duplex 03/26/2023 IMPRESSION: No significant renal artery stenosis by duplex criteria. No other acute finding by renal ultrasound.  Recent Labs: No results found for requested labs within last 365 days.   Recent Lipid Panel    Component Value  Date/Time   CHOL 207 (H) 09/29/2021 0827   TRIG 135 09/29/2021 0827   HDL 47 09/29/2021 0827   CHOLHDL 4.4 09/29/2021 0827   LDLCALC 136 (H) 09/29/2021 0827    Physical Exam:   VS:  There were no vitals taken for this visit.   Wt Readings from Last 3 Encounters:  09/25/22 213 lb (96.6 kg)  09/20/21 216 lb (98 kg)  07/16/21 215 lb (97.5 kg)    General: Well nourished, well developed, in no acute distress Head: Atraumatic, normal size  Eyes: PEERLA, EOMI  Neck: Supple, no JVD Endocrine: No thryomegaly Cardiac: Normal S1, S2; RRR; no murmurs, rubs, or gallops Lungs: Clear to auscultation bilaterally, no wheezing, rhonchi or rales  Abd: Soft, nontender, no hepatomegaly  Ext: No edema, pulses 2+ Musculoskeletal: No deformities, BUE and BLE strength normal and equal Skin: Warm and dry, no rashes   Neuro: Alert and oriented to person, place, time, and situation, CNII-XII grossly intact, no focal deficits  Psych: Normal mood and affect   ASSESSMENT:   Jade Medina is a 52 y.o. female who presents for the following: No diagnosis found.  PLAN:   There are no diagnoses linked to this encounter.  {Are you ordering a CV Procedure (e.g. stress test, cath, DCCV, TEE, etc)?   Press F2        :[id number]}  Disposition: No follow-ups on file.  Medication Adjustments/Labs and Tests Ordered: Current medicines are reviewed at length with the patient today.  Concerns regarding medicines are outlined above.  No orders of the defined types were placed in this encounter.  No orders of the defined types were placed in this encounter.  There are no Patient Instructions on file for this visit.   Time Spent with Patient: I have spent a total of *** minutes with patient reviewing hospital notes, telemetry, EKGs, labs and examining the patient as well as establishing an assessment and plan that was discussed with the patient.  > 50% of time was spent in direct patient care.  Signed, Lenna Gilford.  Flora Lipps, MD, [facility]  [facility]  [address], [address] [place], [place] [address] [phone]  05/16/2023 6:57 PM

## 2023-05-17 ENCOUNTER — Ambulatory Visit: Payer: BC Managed Care – PPO | Attending: Cardiovascular Disease | Admitting: Cardiovascular Disease

## 2023-05-17 DIAGNOSIS — R0602 Shortness of breath: Secondary | ICD-10-CM

## 2023-05-17 DIAGNOSIS — I10 Essential (primary) hypertension: Secondary | ICD-10-CM

## 2023-05-28 DIAGNOSIS — R7989 Other specified abnormal findings of blood chemistry: Secondary | ICD-10-CM | POA: Diagnosis not present

## 2023-05-28 DIAGNOSIS — E538 Deficiency of other specified B group vitamins: Secondary | ICD-10-CM | POA: Diagnosis not present

## 2023-05-28 DIAGNOSIS — E039 Hypothyroidism, unspecified: Secondary | ICD-10-CM | POA: Diagnosis not present

## 2023-05-28 DIAGNOSIS — E119 Type 2 diabetes mellitus without complications: Secondary | ICD-10-CM | POA: Diagnosis not present

## 2023-05-28 DIAGNOSIS — I10 Essential (primary) hypertension: Secondary | ICD-10-CM | POA: Diagnosis not present

## 2023-05-28 DIAGNOSIS — R3129 Other microscopic hematuria: Secondary | ICD-10-CM | POA: Diagnosis not present

## 2023-05-28 DIAGNOSIS — E785 Hyperlipidemia, unspecified: Secondary | ICD-10-CM | POA: Diagnosis not present

## 2023-05-28 DIAGNOSIS — E559 Vitamin D deficiency, unspecified: Secondary | ICD-10-CM | POA: Diagnosis not present

## 2023-05-28 DIAGNOSIS — R7982 Elevated C-reactive protein (CRP): Secondary | ICD-10-CM | POA: Diagnosis not present

## 2023-06-11 ENCOUNTER — Other Ambulatory Visit: Payer: Self-pay | Admitting: Obstetrics and Gynecology

## 2023-06-11 DIAGNOSIS — K219 Gastro-esophageal reflux disease without esophagitis: Secondary | ICD-10-CM

## 2023-06-19 ENCOUNTER — Ambulatory Visit: Payer: BC Managed Care – PPO | Admitting: Cardiovascular Disease

## 2023-07-12 ENCOUNTER — Encounter: Payer: Self-pay | Admitting: Cardiology

## 2023-07-14 ENCOUNTER — Other Ambulatory Visit: Payer: Self-pay | Admitting: Obstetrics and Gynecology

## 2023-07-14 DIAGNOSIS — K219 Gastro-esophageal reflux disease without esophagitis: Secondary | ICD-10-CM

## 2023-07-24 DIAGNOSIS — E559 Vitamin D deficiency, unspecified: Secondary | ICD-10-CM | POA: Diagnosis not present

## 2023-07-24 DIAGNOSIS — E039 Hypothyroidism, unspecified: Secondary | ICD-10-CM | POA: Diagnosis not present

## 2023-07-24 DIAGNOSIS — E538 Deficiency of other specified B group vitamins: Secondary | ICD-10-CM | POA: Diagnosis not present

## 2023-07-24 DIAGNOSIS — I10 Essential (primary) hypertension: Secondary | ICD-10-CM | POA: Diagnosis not present

## 2023-07-24 DIAGNOSIS — E119 Type 2 diabetes mellitus without complications: Secondary | ICD-10-CM | POA: Diagnosis not present

## 2023-07-24 DIAGNOSIS — E785 Hyperlipidemia, unspecified: Secondary | ICD-10-CM | POA: Diagnosis not present

## 2023-08-01 ENCOUNTER — Ambulatory Visit: Payer: BC Managed Care – PPO | Admitting: Cardiology

## 2023-08-27 DIAGNOSIS — M2550 Pain in unspecified joint: Secondary | ICD-10-CM | POA: Diagnosis not present

## 2023-08-27 DIAGNOSIS — R5383 Other fatigue: Secondary | ICD-10-CM | POA: Diagnosis not present

## 2023-08-27 DIAGNOSIS — M791 Myalgia, unspecified site: Secondary | ICD-10-CM | POA: Diagnosis not present

## 2023-08-27 DIAGNOSIS — I10 Essential (primary) hypertension: Secondary | ICD-10-CM | POA: Diagnosis not present

## 2023-08-27 DIAGNOSIS — E538 Deficiency of other specified B group vitamins: Secondary | ICD-10-CM | POA: Diagnosis not present

## 2023-08-27 DIAGNOSIS — F419 Anxiety disorder, unspecified: Secondary | ICD-10-CM | POA: Diagnosis not present

## 2023-09-02 ENCOUNTER — Telehealth: Payer: Self-pay

## 2023-09-02 NOTE — Telephone Encounter (Signed)
Received a urgent referral from Marvell Fuller NP at [facility] for hypertension, elevated H&H, fatigue - wanting eval for sleep study. Placed in sleep mailbox

## 2023-09-14 ENCOUNTER — Other Ambulatory Visit: Payer: Self-pay | Admitting: Obstetrics and Gynecology

## 2023-09-14 DIAGNOSIS — F419 Anxiety disorder, unspecified: Secondary | ICD-10-CM

## 2023-09-16 DIAGNOSIS — R768 Other specified abnormal immunological findings in serum: Secondary | ICD-10-CM | POA: Diagnosis not present

## 2023-09-16 DIAGNOSIS — R682 Dry mouth, unspecified: Secondary | ICD-10-CM | POA: Diagnosis not present

## 2023-09-16 DIAGNOSIS — M25511 Pain in right shoulder: Secondary | ICD-10-CM | POA: Diagnosis not present

## 2023-09-16 DIAGNOSIS — M25512 Pain in left shoulder: Secondary | ICD-10-CM | POA: Diagnosis not present

## 2023-09-16 DIAGNOSIS — R5383 Other fatigue: Secondary | ICD-10-CM | POA: Diagnosis not present

## 2023-09-26 ENCOUNTER — Encounter: Payer: Self-pay | Admitting: Cardiovascular Disease

## 2023-09-26 ENCOUNTER — Ambulatory Visit: Payer: BC Managed Care – PPO | Attending: Cardiology | Admitting: Cardiovascular Disease

## 2023-09-26 VITALS — BP 114/90 | HR 78 | Ht 63.0 in | Wt 222.0 lb

## 2023-09-26 DIAGNOSIS — R0602 Shortness of breath: Secondary | ICD-10-CM | POA: Diagnosis not present

## 2023-09-26 DIAGNOSIS — E785 Hyperlipidemia, unspecified: Secondary | ICD-10-CM | POA: Diagnosis not present

## 2023-09-26 DIAGNOSIS — I10 Essential (primary) hypertension: Secondary | ICD-10-CM | POA: Diagnosis not present

## 2023-09-26 NOTE — Patient Instructions (Signed)
Medication Instructions:  No changes *If you need a refill on your cardiac medications before your next appointment, please call your pharmacy*   Lab Work: Your provider would like for you to have following labs drawn today Aldosterone and renin ratio.   If you have labs (blood work) drawn today and your tests are completely normal, you will receive your results only by: MyChart Message (if you have MyChart) OR A paper copy in the mail If you have any lab test that is abnormal or we need to change your treatment, we will call you to review the results.   Testing/Procedures: Your physician has requested that you have an echocardiogram. Echocardiography is a painless test that uses sound waves to create images of your heart. It provides your doctor with information about the size and shape of your heart and how well your heart's chambers and valves are working.   You may receive an ultrasound enhancing agent through an IV if needed to better visualize your heart during the echo. This procedure takes approximately one hour.  There are no restrictions for this procedure.  This will take place at [address] [facility]) [address], [place] [address]  Please note: We ask at that you not bring children with you during ultrasound (echo/ vascular) testing. Due to room size and safety concerns, children are not allowed in the ultrasound rooms during exams. Our front office staff cannot provide observation of children in our lobby area while testing is being conducted. An adult accompanying a patient to their appointment will only be allowed in the ultrasound room at the discretion of the ultrasound technician under special circumstances. We apologize for any inconvenience.    Follow-Up: At [facility], you and your health needs are our priority.  As part of our continuing mission to provide you with exceptional heart care, we have created designated Provider Care Teams.   These Care Teams include your primary Cardiologist (physician) and Advanced Practice Providers (APPs -  Physician Assistants and Nurse Practitioners) who all work together to provide you with the care you need, when you need it.  We recommend signing up for the patient portal called "MyChart".  Sign up information is provided on this After Visit Summary.  MyChart is used to connect with patients for Virtual Visits (Telemedicine).  Patients are able to view lab/test results, encounter notes, upcoming appointments, etc.  Non-urgent messages can be sent to your provider as well.   To learn more about what you can do with MyChart, go to [email or web address].    Your next appointment:   Follow up with Dr. Kirke Corin or APP after testing Other Instructions Your physician has recommended that you have CT Coronary Calcium Score.  - $99 out of pocket cost at the time of your test - Call [phone] to schedule at your convenience.  [facility] [address] [place], [place] [address]   Coronary CalciumScan A coronary calcium scan is an imaging test used to look for deposits of calcium and other fatty materials (plaques) in the inner lining of the blood vessels of the heart (coronary arteries). These deposits of calcium and plaques can partly clog and narrow the coronary arteries without producing any symptoms or warning signs. This puts a person at risk for a heart attack. This test can detect these deposits before symptoms develop. Tell a health care provider about: Any allergies you have. All medicines you are taking, including vitamins, herbs, eye drops,  creams, and over-the-counter medicines. Any problems you or family members have had with anesthetic medicines. Any blood disorders you have. Any surgeries you have had. Any medical conditions you have. Whether you are pregnant or may be pregnant. What are the risks? Generally, this is a safe  procedure. However, problems may occur, including: Harm to a pregnant woman and her unborn baby. This test involves the use of radiation. Radiation exposure can be dangerous to a pregnant woman and her unborn baby. If you are pregnant, you generally should not have this procedure done. Slight increase in the risk of cancer. This is because of the radiation involved in the test. What happens before the procedure? No preparation is needed for this procedure. What happens during the procedure? You will undress and remove any jewelry around your neck or chest. You will put on a hospital gown. Sticky electrodes will be placed on your chest. The electrodes will be connected to an electrocardiogram (ECG) machine to record a tracing of the electrical activity of your heart. A CT scanner will take pictures of your heart. During this time, you will be asked to lie still and hold your breath for 2-3 seconds while a picture of your heart is being taken. The procedure may vary among health care providers and hospitals. What happens after the procedure? You can get dressed. You can return to your normal activities. It is up to you to get the results of your test. Ask your health care provider, or the department that is doing the test, when your results will be ready. Summary A coronary calcium scan is an imaging test used to look for deposits of calcium and other fatty materials (plaques) in the inner lining of the blood vessels of the heart (coronary arteries). Generally, this is a safe procedure. Tell your health care provider if you are pregnant or may be pregnant. No preparation is needed for this procedure. A CT scanner will take pictures of your heart. You can return to your normal activities after the scan is done. This information is not intended to replace advice given to you by your health care provider. Make sure you discuss any questions you have with your health care provider. Document Released:  05/03/2008 Document Revised: 09/24/2016 Document Reviewed: 09/24/2016 Elsevier Interactive Patient Education  2017 [employer].

## 2023-09-26 NOTE — Progress Notes (Signed)
Cardiology Office Note   Date:  09/26/2023   ID:  Tametra P Kea, [place] Feb 08, 1971, MRN [id number]  PCP:  Rica Records, PA-C  Cardiologist:   Lorine Bears, MD   Chief Complaint  Patient presents with   New Patient (Initial Visit)    Referred for cardiac evaluation of hypertension and elevated CRP.        History of Present Illness: Jade Medina is a 52 y.o. female who was referred by Marvell Fuller for evaluation of hypertension and elevated CRP. She has no prior cardiac history.  She has been relatively healthy throughout her life but does report 40 pound of weight gain over the last 2 years.  She started having high blood pressure about 6 months ago which has been difficult to control. She had renal artery duplex done in May which showed no evidence of renal artery stenosis. She does snore loudly and she is scheduled for a sleep study in the near future.  She is not a smoker and drinks alcohol occasionally.  She reports exertional dyspnea without chest discomfort. She has family history of coronary artery disease as her father had myocardial infarction and stroke in his 11s. She had labs done in [place] which showed elevated C-reactive protein at 40 with normal sedimentation rate.  She was evaluated by rheumatology and no significant abnormalities were found.   Past Medical History:  Diagnosis Date   Anxiety    Colon polyps    Diabetes type 2, controlled (HCC)    Edema    GERD (gastroesophageal reflux disease)    Hyperlipidemia    Hypertension    Menorrhagia    Obesity     Past Surgical History:  Procedure Laterality Date   CESAREAN SECTION  1995   CHOLECYSTECTOMY  2004   COLONOSCOPY WITH PROPOFOL N/A 11/10/2020   repeat in 3 yrs; Procedure: COLONOSCOPY WITH PROPOFOL;  Surgeon: [name], MD;  Location: [facility] ENDOSCOPY;  Service: Gastroenterology;  Laterality: N/A;   ESOPHAGOGASTRODUODENOSCOPY (EGD) WITH PROPOFOL N/A 11/10/2020   Procedure:  ESOPHAGOGASTRODUODENOSCOPY (EGD) WITH PROPOFOL;  Surgeon: [name], MD;  Location: [facility] ENDOSCOPY;  Service: Gastroenterology;  Laterality: N/A;   IUD placement  12/07/2011   LAPAROSCOPIC GASTRIC BAND REMOVAL WITH LAPAROSCOPIC GASTRIC SLEEVE RESECTION  08/2016   TOTAL HIP ARTHROPLASTY  2011, 2015   UPPER GI ENDOSCOPY     weightloss       Current Outpatient Medications  Medication Sig Dispense Refill   levonorgestrel (MIRENA, 52 MG,) 20 MCG/24HR IUD 1 Intra Uterine Device (1 each total) by Intrauterine route once. 1 Intra Uterine Device 0   losartan (COZAAR) 100 MG tablet Take 100 mg by mouth daily.     pantoprazole (PROTONIX) 40 MG tablet Take 40 mg QD to BID 120 tablet 3   ALPRAZolam (XANAX) 0.25 MG tablet Take 0.25 mg by mouth 2 (two) times daily as needed.     amLODipine (NORVASC) 5 MG tablet Take 10 mg by mouth daily.     buPROPion (WELLBUTRIN XL) 300 MG 24 hr tablet Take 300 mg by mouth daily.     levocetirizine (XYZAL) 5 MG tablet Take 5 mg by mouth every evening.     levothyroxine (SYNTHROID) 50 MCG tablet Take 50 mcg by mouth daily before breakfast.     MOUNJARO 2.5 MG/0.5ML Pen Inject 2.5 mg into the skin once a week.     sertraline (ZOLOFT) 100 MG tablet TAKE 1/2 TO 1 TABLET DAILY (Patient not taking: Reported on  09/26/2023) 90 tablet 0   Vitamin D-Vitamin K (D3 + K2 PO)      No current facility-administered medications for this visit.    Allergies:   Morphine    Social History:  The patient  reports that she has never smoked. She has never used smokeless tobacco. She reports that she does not drink alcohol and does not use drugs.   Family History:  The patient's family history includes Heart disease in her father; Hypertension in her father; Stroke in her father.    ROS:  Please see the history of present illness.   Otherwise, review of systems are positive for none.   All other systems are reviewed and negative.    PHYSICAL EXAM: VS:  BP (!) 114/90 (BP Location:  Right Arm, Patient Position: Sitting, Cuff Size: Large)   Pulse 78   Ht 5\' 3"  (1.6 m)   Wt 222 lb (100.7 kg)   SpO2 97%   BMI 39.33 kg/m  , BMI Body mass index is 39.33 kg/m. GEN: Well nourished, well developed, in no acute distress  HEENT: normal  Neck: no JVD, carotid bruits, or masses Cardiac: RRR; no murmurs, rubs, or gallops,no edema  Respiratory:  clear to auscultation bilaterally, normal work of breathing GI: soft, nontender, nondistended, + BS MS: no deformity or atrophy  Skin: warm and dry, no rash Neuro:  Strength and sensation are intact Psych: euthymic mood, full affect   EKG:  EKG is ordered today. The ekg ordered today demonstrates : Sinus rhythm with Premature atrial complexes Right bundle branch block When compared with ECG of 16-Jul-2021 09:25, Premature atrial complexes are now Present    Recent Labs: No results found for requested labs within last 365 days.    Lipid Panel    Component Value Date/Time   CHOL 207 (H) 09/29/2021 0827   TRIG 135 09/29/2021 0827   HDL 47 09/29/2021 0827   CHOLHDL 4.4 09/29/2021 0827   LDLCALC 136 (H) 09/29/2021 0827      Wt Readings from Last 3 Encounters:  09/26/23 222 lb (100.7 kg)  09/25/22 213 lb (96.6 kg)  09/20/21 216 lb (98 kg)          09/26/2023    2:21 PM  PAD Screen  Previous PAD dx? No  Previous surgical procedure? No  Pain with walking? No  Feet/toe relief with dangling? No  Painful, non-healing ulcers? No  Extremities discolored? No      ASSESSMENT AND PLAN:  1.  Resistant hypertension: Considering her age and the sudden elevation in blood pressure over the last 6 months, I agree that she should be evaluated for secondary hypertension.  There was no evidence of renal artery stenosis on on renal artery duplex.  We have to rule out sleep apnea and she is scheduled for a sleep study in the near future.  In addition, I am going to obtain aldosterone renal ratio to exclude hyperaldosteronism.   Continue same medications for now and we can consider a small dose thiazide diuretic if needed. She might be a good candidate for renal denervation if blood pressure continues to be difficult to control.  2.  Exertional dyspnea: I requested an echocardiogram.  3.  Hyperlipidemia with elevated C-reactive protein: Family history of premature coronary artery disease.  I recommend coronary calcium score which she is agreeable with.    Disposition:   FU after cardiac testing.  Signed,  Lorine Bears, MD  09/26/2023 2:28 PM    [facility]

## 2023-09-27 ENCOUNTER — Ambulatory Visit
Admission: RE | Admit: 2023-09-27 | Discharge: 2023-09-27 | Disposition: A | Discharge status: Home / Self Care | Payer: BC Managed Care – PPO | Source: Ambulatory Visit | Attending: Cardiovascular Disease | Admitting: Cardiovascular Disease

## 2023-09-27 DIAGNOSIS — E785 Hyperlipidemia, unspecified: Secondary | ICD-10-CM | POA: Insufficient documentation

## 2023-10-03 ENCOUNTER — Ambulatory Visit: Payer: BC Managed Care – PPO | Admitting: Obstetrics and Gynecology

## 2023-10-03 ENCOUNTER — Encounter: Payer: Self-pay | Admitting: Neurology

## 2023-10-03 ENCOUNTER — Ambulatory Visit: Payer: BC Managed Care – PPO | Admitting: Neurology

## 2023-10-03 VITALS — BP 124/89 | HR 85 | Ht 63.0 in | Wt 218.0 lb

## 2023-10-03 DIAGNOSIS — R5382 Chronic fatigue, unspecified: Secondary | ICD-10-CM | POA: Diagnosis not present

## 2023-10-03 DIAGNOSIS — Z6838 Body mass index (BMI) 38.0-38.9, adult: Secondary | ICD-10-CM

## 2023-10-03 DIAGNOSIS — Z9189 Other specified personal risk factors, not elsewhere classified: Secondary | ICD-10-CM | POA: Diagnosis not present

## 2023-10-03 DIAGNOSIS — J302 Other seasonal allergic rhinitis: Secondary | ICD-10-CM

## 2023-10-03 DIAGNOSIS — R0683 Snoring: Secondary | ICD-10-CM | POA: Diagnosis not present

## 2023-10-03 LAB — ALDOSTERONE + RENIN ACTIVITY W/ RATIO
Aldos/Renin Ratio: 0.4 (ref 0.0–30.0)
Aldosterone: 1.9 ng/dL (ref 0.0–30.0)
Renin Activity, Plasma: 4.564 ng/mL/h (ref 0.167–5.380)

## 2023-10-03 NOTE — Patient Instructions (Signed)
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  There has been a degree of fatigue rather than sleepiness that she describes but if she has the opportunity to take a nap it is not difficult for her to fall asleep and it may last 1 to 2 hours.  She has been witnessed to snore but she has not been told that she truly has apnea.  She does have many risk factors for the presence of obstructive sleep apnea including a still elevated body mass index, a larger than average neck size and a really small upper airway.   My goal would be to screen her by a home sleep test for sleep apnea.   I would like to remark that the patient felt that her fatigue has been improving on an increasing dose of Wellbutrin as a morning medication.   We will meet after the home sleep test has been interpreted.  I would also like to add a nasal spray to help her nasal airway to be as patent as possible.  There will be no other medication or laboratories prescribed today.

## 2023-10-03 NOTE — Progress Notes (Signed)
SLEEP MEDICINE CLINIC    Provider:  Melvyn Novas, MD  Primary Care Physician:  Berniece Pap, FNP [address] [place] [address]     Referring Provider: Berniece Pap, Fnp [address] [place],  [place] [address]          Chief Complaint according to patient   Patient presents with:     New Sleep Patient (Initial Visit)           HISTORY OF PRESENT ILLNESS:  Jade Medina is a 52 y.o. female patient who is seen upon referral on 10/03/2023 from FNP [place] for a Sleep Medicine consult.   Chief concern according to patient :  " I have recently woken myself up from snoring and my spouse has reported that I snore- as does he".    I have the pleasure of seeing Jade Medina 10/03/23 a right-handed  married and Caucasian female with a possible sleep disorder.   Sleep relevant medical history: wore braces, wisdom teeth extraction, sinusitis, allergic congestion. Overbite and crowded teeth. Obesity. GERD.  Family medical /sleep history: father had CVA and MI at age 79  in 37- high cholesterol, high BP.  no biological family member on CPAP with OSA, insomnia, sleep walkers.    Social history: Patient is working as an Print production planner - and lives in a household with spouse,  and father -  cats and dogs. The couple has 2 grown children.  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  Tobacco use: none.  ETOH use ; wine 3 a week,  Caffeine intake in form of Coffee( 1 cup in AM ) Soda( /) Tea ( /) , no energy drinks Exercise: none regular .   Hobbies : n/a   Sleep habits are as follows: The patient's dinner time is between 6 PM. The patient goes to bed at 8.30 PM and asleep by 9.30-10 , continues to sleep for 8 hours, wakes for 1 bathroom break, the first time at 2-3 AM.   The preferred sleep position is sideways - , with the support of 2 pillows. Dreams are reportedly frequent/vivid.  The patient wakes up  with an alarm at 6.15- 6.30. Finally 6.45 AM is the usual rise time. She reports not feeling refreshed or restored in AM, independent of the sleep duration- with symptoms such as very dry mouth, morning headaches, and residual fatigue.  Naps are taken on Sundays , lasting from 1-2 hours,  and are more refreshing than nocturnal sleep.    Review of Systems: Out of a complete 14 system review, the patient complains of only the following symptoms, and all other reviewed systems are negative.:  Fatigue, sleepiness , snoring, fragmented sleep.    How likely are you to doze in the following situations: 0 = not likely, 1 = slight chance, 2 = moderate chance, 3 = high chance   Sitting and Reading? Watching Television? Sitting inactive in a public place (theater or meeting)? As a passenger in a car for an hour without a break? Lying down in the afternoon when circumstances permit? Sitting and talking to someone? Sitting quietly after lunch without alcohol? In a car, while stopped for a few minutes in traffic?   Total = 6/ 24 points   FSS endorsed at 36/ 63 points.   Social History   Socioeconomic History   Marital status: Married    Spouse name: Not  on file   Number of children: Not on file   Years of education: HS,    Highest education level: Not on file  Occupational History   Not on file                                  office manager , book keeper  Tobacco Use   Smoking status: Never   Smokeless tobacco: Never  Vaping Use   Vaping status: Never Used  Substance and Sexual Activity   Alcohol use: No   Drug use: No   Sexual activity: Yes    Partners: Male    Birth control/protection: I.U.D., Surgical    Comment: Mirena/Vasectomy  Other Topics Concern   Not on file  Social History Narrative   Not on file   Social Determinants of Health   Financial Resource Strain: Not on file  Food Insecurity: Not on file  Transportation Needs: Not on file  Physical Activity:  Insufficiently Active (12/18/2017)   Exercise Vital Sign    Days of Exercise per Week: 1 day    Minutes of Exercise per Session: 30 min  Stress: Stress Concern Present (12/18/2017)   [employer] of Occupational Health - Occupational Stress Questionnaire    Feeling of Stress : Rather much  Social Connections: Moderately Integrated (12/18/2017)   Social Connection and Isolation Panel [NHANES]    Frequency of Communication with Friends and Family: More than three times a week    Frequency of Social Gatherings with Friends and Family: Twice a week    Attends Religious Services: More than 4 times per year    Active Member of [employer] or Organizations: No    Attends Banker Meetings: Never    Marital Status: Married    Family History  Problem Relation Age of Onset   Heart disease Father    Stroke Father    Hypertension Father    Breast cancer Neg Hx     Past Medical History:  Diagnosis Date   Anxiety    Colon polyps    Diabetes type 2, controlled (HCC)    Edema    GERD (gastroesophageal reflux disease)    Hyperlipidemia    Hypertension    Menorrhagia    Obesity     Past Surgical History:  Procedure Laterality Date   CESAREAN SECTION  1995   CHOLECYSTECTOMY  2004   COLONOSCOPY WITH PROPOFOL N/A 11/10/2020   repeat in 3 yrs; Procedure: COLONOSCOPY WITH PROPOFOL;  Surgeon: [name], MD;  Location: [facility] ENDOSCOPY;  Service: Gastroenterology;  Laterality: N/A;   ESOPHAGOGASTRODUODENOSCOPY (EGD) WITH PROPOFOL N/A 11/10/2020   Procedure: ESOPHAGOGASTRODUODENOSCOPY (EGD) WITH PROPOFOL;  Surgeon: [name], MD;  Location: [facility] ENDOSCOPY;  Service: Gastroenterology;  Laterality: N/A;   IUD placement  12/07/2011   LAPAROSCOPIC GASTRIC BAND REMOVAL WITH LAPAROSCOPIC GASTRIC SLEEVE RESECTION  08/2016   TOTAL HIP ARTHROPLASTY  2011, 2015   UPPER GI ENDOSCOPY     weightloss       Current Outpatient Medications on File Prior to Visit  Medication Sig Dispense Refill    ALPRAZolam (XANAX) 0.25 MG tablet Take 0.25 mg by mouth 2 (two) times daily as needed.     amLODipine (NORVASC) 5 MG tablet Take 10 mg by mouth daily.     buPROPion (WELLBUTRIN XL) 300 MG 24 hr tablet Take 300 mg by mouth daily.     levocetirizine (XYZAL) 5 MG tablet  Take 5 mg by mouth every evening.     levothyroxine (SYNTHROID) 50 MCG tablet Take 50 mcg by mouth daily before breakfast.     losartan (COZAAR) 100 MG tablet Take 100 mg by mouth daily.     MOUNJARO 2.5 MG/0.5ML Pen Inject 2.5 mg into the skin once a week.     pantoprazole (PROTONIX) 40 MG tablet Take 40 mg QD to BID 120 tablet 3   telmisartan (MICARDIS) 40 MG tablet Take 40 mg by mouth daily.     Vitamin D-Vitamin K (D3 + K2 PO)      levonorgestrel (MIRENA, 52 MG,) 20 MCG/24HR IUD 1 Intra Uterine Device (1 each total) by Intrauterine route once. 1 Intra Uterine Device 0   sertraline (ZOLOFT) 100 MG tablet TAKE 1/2 TO 1 TABLET DAILY (Patient not taking: Reported on 09/26/2023) 90 tablet 0   No current facility-administered medications on file prior to visit.    Allergies  Allergen Reactions   Morphine Nausea And Vomiting and Other (See Comments)     DIAGNOSTIC DATA (LABS, IMAGING, TESTING) - I reviewed patient records, labs, notes, testing and imaging myself where available.  Lab Results  Component Value Date   WBC 8.6 07/16/2021   HGB 15.2 (H) 07/16/2021   HCT 43.6 07/16/2021   MCV 92.4 07/16/2021   PLT 310 07/16/2021      Component Value Date/Time   NA 138 10/16/2021 1613   NA 139 07/18/2014 1205   K 4.0 10/16/2021 1613   K 3.9 07/18/2014 1205   CL 104 10/16/2021 1613   CL 104 07/18/2014 1205   CO2 20 10/16/2021 1613   CO2 26 07/18/2014 1205   GLUCOSE 95 10/16/2021 1613   GLUCOSE 120 (H) 07/16/2021 0938   GLUCOSE 177 (H) 07/18/2014 1205   BUN 17 10/16/2021 1613   BUN 17 07/18/2014 1205   CREATININE 0.83 10/16/2021 1613   CREATININE 0.88 07/18/2014 1205   CALCIUM 8.7 10/16/2021 1613   CALCIUM 8.7  07/18/2014 1205   PROT 6.4 10/16/2021 1613   ALBUMIN 4.2 10/16/2021 1613   AST 25 10/16/2021 1613   ALT 21 10/16/2021 1613   ALKPHOS 62 10/16/2021 1613   BILITOT 0.3 10/16/2021 1613   GFRNONAA >60 07/16/2021 0938   GFRNONAA >60 07/18/2014 1205   GFRAA 117 12/19/2017 0911   GFRAA >60 07/18/2014 1205   Lab Results  Component Value Date   CHOL 207 (H) 09/29/2021   HDL 47 09/29/2021   LDLCALC 136 (H) 09/29/2021   TRIG 135 09/29/2021   CHOLHDL 4.4 09/29/2021   Lab Results  Component Value Date   HGBA1C 5.7 (H) 09/29/2021   Lab Results  Component Value Date   VITAMINB12 542 09/29/2021   Lab Results  Component Value Date   TSH 3.880 09/29/2021    PHYSICAL EXAM:  Today's Vitals   10/03/23 1109  BP: 124/89  Pulse: 85  Weight: 218 lb (98.9 kg)  Height: 5\' 3"  (1.6 m)   Body mass index is 38.62 kg/m.   Wt Readings from Last 3 Encounters:  10/03/23 218 lb (98.9 kg)  09/26/23 222 lb (100.7 kg)  09/25/22 213 lb (96.6 kg)     Ht Readings from Last 3 Encounters:  10/03/23 5\' 3"  (1.6 m)  09/26/23 5\' 3"  (1.6 m)  09/25/22 5\' 3"  (1.6 m)      General: The patient is awake, alert and appears not in acute distress. The patient is well groomed. Head: Normocephalic, atraumatic. Neck is supple. Mallampati  3,  neck circumference:16. 25  inches . Nasal airflow barely patent.  Retrognathia is not  seen.  Small oral opening , crowded teeth.  Dental status:  Cardiovascular:  Regular rate and cardiac rhythm by pulse,  without distended neck veins. Respiratory: Lungs are clear to auscultation.  Skin:  Without evidence of ankle edema, or rash. Trunk: The patient's posture is erect.   NEUROLOGIC EXAM: The patient is awake and alert, oriented to place and time.   Memory subjective described as intact.  Attention span & concentration ability appears normal.  Speech is fluent,  without  dysarthria, dysphonia or aphasia.  Mood and affect are appropriate.   Cranial nerves: no loss of  smell or taste reported  Pupils are equal and briskly reactive to light. Funduscopic exam deferred.  Extraocular movements in vertical and horizontal planes were intact and without nystagmus. No Diplopia. Visual fields by finger perimetry are intact. Hearing was intact to soft voice and finger rubbing.    Facial sensation intact to fine touch.  Facial motor strength is symmetric and tongue and uvula move midline.  Neck ROM : rotation, tilt and flexion extension were normal for age and shoulder shrug was symmetrical.    Motor exam:  Symmetric bulk, tone and ROM.   Normal tone without cog -wheeling, symmetric grip strength .   Sensory:  Fine touch and vibration were tested  and  normal.  Proprioception tested in the upper extremities was normal.   Coordination: The Finger-to-nose maneuver was intact without evidence of ataxia, dysmetria or tremor.   Gait and station: Patient could rise unassisted from a seated position, walked without assistive device.  Stance is of normal width/ base.  Toe and heel walk were deferred.  Deep tendon reflexes: in the  upper and lower extremities are symmetric and intact.  Babinski response was deferred.     ASSESSMENT AND PLAN 52 y.o. year old female  here with:    1) nonfocal neurologic examination.  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  There has been a degree of fatigue rather than sleepiness that she describes but if she has the opportunity to take a nap it is not difficult for her to fall asleep and it may last 1 to 2 hours.  She has been witnessed to snore but she has not been told that she truly has apnea.  She does have many risk factors for the presence of obstructive sleep apnea including a still elevated body mass index, a larger than average neck size and a really small upper airway.  My goal would be to screen her by a home sleep test for sleep  apnea.  I would like to remark that the patient felt that her fatigue has been improving on an increasing dose of Wellbutrin as a morning medication.  We will meet after the home sleep test has been interpreted.  I would also like to add a nasal spray to help her nasal airway to be as patent as possible.  There will be no other medication or laboratories prescribed today.    I plan to follow up either personally or through our NP within 3-5 months.   I would like to thank  F[place], Eula Fried, Fnp [id number] [place],  [place] [address] for allowing me to meet with and to take care of this pleasant patient.   After spending a total time of  40  minutes face to face and additional time for physical and neurologic examination, review of laboratory studies,  personal review of imaging studies, reports and results of other testing and review of referral information / records as far as provided in visit,   Electronically signed by: Melvyn Novas, MD 10/03/2023 11:32 AM  [facility] Board certified by The [employer] of Sleep Medicine and Diplomate of the [employer] of Sleep Medicine. Board certified In Neurology through the ABPN, Fellow of the [employer] of Neurology.

## 2023-10-09 ENCOUNTER — Telehealth: Payer: Self-pay | Admitting: Cardiovascular Disease

## 2023-10-09 DIAGNOSIS — Z79899 Other long term (current) drug therapy: Secondary | ICD-10-CM

## 2023-10-09 MED ORDER — ROSUVASTATIN CALCIUM 10 MG PO TABS: 10.0000 mg | ORAL_TABLET | Freq: Every day | ORAL | 3 refills | Status: DC

## 2023-10-09 NOTE — Telephone Encounter (Signed)
The patient has been notified of the result along with recommendations. Pt verbalized understanding. All questions (if any) were answered   Rosuvastatin 10 mg sent to requested pharmacy  Lab orders placed    Iran Ouch, MD  Sandi Mariscal, RN Mildly elevated calcium score but still below 100.  Recommend adding rosuvastatin 10 mg daily.  Check lipid and liver profile in 6 to 8 weeks.

## 2023-10-09 NOTE — Telephone Encounter (Signed)
Patient is calling back to receive results

## 2023-10-14 ENCOUNTER — Telehealth: Payer: Self-pay | Admitting: Neurology

## 2023-10-14 NOTE — Telephone Encounter (Signed)
[facility]ST- [employer] pending faxed notes

## 2023-10-16 ENCOUNTER — Ambulatory Visit: Payer: BC Managed Care – PPO | Attending: Cardiovascular Disease

## 2023-10-16 DIAGNOSIS — R0602 Shortness of breath: Secondary | ICD-10-CM | POA: Diagnosis not present

## 2023-10-16 LAB — ECHOCARDIOGRAM COMPLETE
Area-P 1/2: 4.8 cm2
S' Lateral: 3.4 cm

## 2023-10-28 NOTE — Progress Notes (Unsigned)
PCP:  Berniece Pap, FNP   No chief complaint on file.    HPI:      [name] is a 52 y.o. [id number] who LMP was No LMP recorded. (Menstrual status: IUD)., presents today for her annual examination.  Her menses are absent with IUD, was having occas light spotting for a few days only every couple of months but now with light spotting for several days several times a month for the past 6 months. Dysmenorrhea none. Mirena REplaced 02/06/17 for menorrhagia.   Sex activity: single partner, contraception - vasectomy and IUD. No pain/bleeding.  Last Pap: 09/25/22 Results were: neg/neg HPV DNA  Hx of STDs: none  Last mammogram: 03/06/23  Results were: normal--routine follow-up in 12 months There is no FH of breast cancer. There is no FH of ovarian cancer. The patient does do self-breast exams.  Tobacco use: The patient denies current or previous tobacco use. Alcohol use: none No drug use.  Exercise: mod active  She does get adequate calcium and Vitamin D in her diet. S/P bariatric surg. Due for yearly fasting labs. Done 1/23.   Colonoscopy: 10/2020 with Dr. Tobi Bastos, repeat after 3 yrs; EGD 12/21 was normal.  Takes pantoprazole for GERD and needs RF. Pt occas wakes with GERD sx a few times a month and very bothersome to her. Tried tums per GI without relief. He suggested she could take BID.   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 Started zoloft 3 yrs ago, taking 100 mg now with sx control.   Labs Due??!?!?!  Past Medical History:  Diagnosis Date   Anxiety    Colon polyps    Diabetes type 2, controlled (HCC)    Edema    GERD (gastroesophageal reflux disease)    Hyperlipidemia    Hypertension    Menorrhagia    Obesity     Past Surgical History:  Procedure Laterality Date   CESAREAN SECTION  1995   CHOLECYSTECTOMY  2004   COLONOSCOPY WITH PROPOFOL N/A 11/10/2020   repeat in 3 yrs; Procedure: COLONOSCOPY WITH PROPOFOL;  Surgeon: [name], MD;  Location:  [facility] ENDOSCOPY;  Service: Gastroenterology;  Laterality: N/A;   ESOPHAGOGASTRODUODENOSCOPY (EGD) WITH PROPOFOL N/A 11/10/2020   Procedure: ESOPHAGOGASTRODUODENOSCOPY (EGD) WITH PROPOFOL;  Surgeon: [name], MD;  Location: [facility] ENDOSCOPY;  Service: Gastroenterology;  Laterality: N/A;   IUD placement  12/07/2011   LAPAROSCOPIC GASTRIC BAND REMOVAL WITH LAPAROSCOPIC GASTRIC SLEEVE RESECTION  08/2016   TOTAL HIP ARTHROPLASTY  2011, 2015   UPPER GI ENDOSCOPY     weightloss      Family History  Problem Relation Age of Onset   Heart disease Father    Stroke Father    Hypertension Father    Breast cancer Neg Hx     Social History   Socioeconomic History   Marital status: Married    Spouse name: Not on file   Number of children: Not on file   Years of education: Not on file   Highest education level: Not on file  Occupational History   Not on file  Tobacco Use   Smoking status: Never   Smokeless tobacco: Never  Vaping Use   Vaping status: Never Used  Substance and Sexual Activity   Alcohol use: No   Drug use: No   Sexual activity: Yes    Partners: Male    Birth control/protection: I.U.D., Surgical    Comment: Mirena/Vasectomy  Other Topics Concern  Not on file  Social History Narrative   Not on file   Social Determinants of Health   Financial Resource Strain: Not on file  Food Insecurity: Not on file  Transportation Needs: Not on file  Physical Activity: Insufficiently Active (12/18/2017)   Exercise Vital Sign    Days of Exercise per Week: 1 day    Minutes of Exercise per Session: 30 min  Stress: Stress Concern Present (12/18/2017)   [employer] of Occupational Health - Occupational Stress Questionnaire    Feeling of Stress : Rather much  Social Connections: Moderately Integrated (12/18/2017)   Social Connection and Isolation Panel [NHANES]    Frequency of Communication with Friends and Family: More than three times a week    Frequency of Social Gatherings  with Friends and Family: Twice a week    Attends Religious Services: More than 4 times per year    Active Member of [employer] or Organizations: No    Attends Banker Meetings: Never    Marital Status: Married  Catering manager Violence: Not At Risk (12/18/2017)   Humiliation, Afraid, Rape, and Kick questionnaire    Fear of Current or Ex-Partner: No    Emotionally Abused: No    Physically Abused: No    Sexually Abused: No    No outpatient medications have been marked as taking for the 10/29/23 encounter (Appointment) with Veto Macqueen B, PA-C.     ROS:  Review of Systems  Constitutional:  Negative for fatigue, fever and unexpected weight change.  Respiratory:  Negative for cough, shortness of breath and wheezing.   Cardiovascular:  Negative for chest pain, palpitations and leg swelling.  Gastrointestinal:  Negative for blood in stool, constipation, diarrhea, nausea and vomiting.  Endocrine: Negative for cold intolerance, heat intolerance and polyuria.  Genitourinary:  Negative for dyspareunia, dysuria, flank pain, frequency, genital sores, hematuria, menstrual problem, pelvic pain, urgency, vaginal bleeding, vaginal discharge and vaginal pain.  Musculoskeletal:  Negative for back pain, joint swelling and myalgias.  Skin:  Negative for rash.  Neurological:  Negative for dizziness, syncope, light-headedness, numbness and headaches.  Hematological:  Negative for adenopathy.  Psychiatric/Behavioral:  Negative for agitation, confusion, sleep disturbance and suicidal ideas. The patient is not nervous/anxious.      Objective: There were no vitals taken for this visit.   Physical Exam Constitutional:      Appearance: She is well-developed.  Genitourinary:     Vulva normal.     Right Labia: No rash, tenderness or lesions.    Left Labia: No tenderness, lesions or rash.    No vaginal discharge, erythema or tenderness.      Right Adnexa: not tender and no mass present.     Left Adnexa: not tender and no mass present.    No cervical motion tenderness, friability or polyp.     IUD strings visualized.     Uterus is not enlarged or tender.  Breasts:    Right: No mass, nipple discharge, skin change or tenderness.     Left: No mass, nipple discharge, skin change or tenderness.  Neck:     Thyroid: No thyromegaly.  Cardiovascular:     Rate and Rhythm: Normal rate and regular rhythm.     Heart sounds: Normal heart sounds. No murmur heard. Pulmonary:     Effort: Pulmonary effort is normal.     Breath sounds: Normal breath sounds.  Abdominal:     Palpations: Abdomen is soft.     Tenderness: There is no  abdominal tenderness. There is no guarding or rebound.  Musculoskeletal:        General: Normal range of motion.     Cervical back: Normal range of motion.  Lymphadenopathy:     Cervical: No cervical adenopathy.  Neurological:     General: No focal deficit present.     Mental Status: She is alert and oriented to person, place, and time.     Cranial Nerves: No cranial nerve deficit.  Skin:    General: Skin is warm and dry.  Psychiatric:        Mood and Affect: Mood normal.        Behavior: Behavior normal.        Thought Content: Thought content normal.        Judgment: Judgment normal.  Vitals reviewed.     Assessment/Plan:  Encounter for annual routine gynecological examination  Cervical cancer screening - Plan: Cytology - PAP  Screening for HPV (human papillomavirus) - Plan: Cytology - PAP  ASCUS of cervix with negative high risk HPV - Plan: Cytology - PAP; repeat pap today  Encounter for routine checking of intrauterine contraceptive device (IUD)--IUD strings in cx os; has 8 yr indication  Breakthrough bleeding with IUD--common sx after 4 yrs with Mirena; if sx persist, can RTO for replacement early. F/u prn.   Encounter for screening mammogram for malignant neoplasm of breast - Plan: MM 3D SCREEN BREAST BILATERAL; pt to schedule  mammo  Gastroesophageal reflux disease, unspecified whether esophagitis present - Plan: pantoprazole (PROTONIX) 40 MG tablet; Rx RF. Takes daily but occas needs BID, ok per GI  H/O bariatric surgery--labs done 1/23  Anxiety - Plan: sertraline (ZOLOFT) 100 MG tablet; doing well, Rx RF eRxd.    No orders of the defined types were placed in this encounter.             GYN counsel breast self exam, mammography screening, adequate intake of calcium and vitamin D, diet and exercise     F/U  No follow-ups on file.  Ebubechukwu Jedlicka B. Pelagia Iacobucci, PA-C 10/28/2023 6:57 PM

## 2023-10-28 NOTE — Telephone Encounter (Signed)
HST- [employer] no auth req via fax form.

## 2023-10-29 ENCOUNTER — Ambulatory Visit (INDEPENDENT_AMBULATORY_CARE_PROVIDER_SITE_OTHER): Payer: BC Managed Care – PPO | Admitting: Obstetrics and Gynecology

## 2023-10-29 ENCOUNTER — Encounter: Payer: Self-pay | Admitting: Obstetrics and Gynecology

## 2023-10-29 VITALS — BP 105/70 | HR 97 | Ht 63.0 in | Wt 219.0 lb

## 2023-10-29 DIAGNOSIS — Z1231 Encounter for screening mammogram for malignant neoplasm of breast: Secondary | ICD-10-CM

## 2023-10-29 DIAGNOSIS — Z01419 Encounter for gynecological examination (general) (routine) without abnormal findings: Secondary | ICD-10-CM

## 2023-10-29 DIAGNOSIS — Z9884 Bariatric surgery status: Secondary | ICD-10-CM

## 2023-10-29 DIAGNOSIS — F419 Anxiety disorder, unspecified: Secondary | ICD-10-CM

## 2023-10-29 DIAGNOSIS — K219 Gastro-esophageal reflux disease without esophagitis: Secondary | ICD-10-CM

## 2023-10-29 DIAGNOSIS — Z1211 Encounter for screening for malignant neoplasm of colon: Secondary | ICD-10-CM

## 2023-10-29 DIAGNOSIS — Z30431 Encounter for routine checking of intrauterine contraceptive device: Secondary | ICD-10-CM

## 2023-10-29 NOTE — Patient Instructions (Signed)
I value your feedback and you entrusting us with your care. If you get a [facility] patient survey, I would appreciate you taking the time to let us know about your experience today. Thank you! ? ? ?

## 2023-10-31 NOTE — Progress Notes (Signed)
Cardiology Office Note    Date:  11/01/2023   ID:  Jade Medina, Jade Medina 05/23/1971, MRN [id number]  PCP:  Berniece Pap, FNP  Cardiologist:  Lorine Bears, MD  Electrophysiologist:  None   Chief Complaint: Follow up  History of Present Illness:   Jade Medina is a 52 y.o. female with history of CAD by CT imaging, HTN, HLD, sleep disordered breathing, and family history of premature CAD who presents for follow-up of calcium score and echo.  She was evaluated by Dr. Kirke Corin as a new patient on 09/26/2023 at the request of her PCP for hypertension and elevated CRP.  She had no prior cardiac history.  She reported a 40 pound weight gain over the preceding 2 years and started having elevated BP readings approximately 6 months prior to her visit.  Renal artery duplex done in 03/2023 showed no evidence of RAS.  She reported snoring loudly and was scheduled for a sleep study.  She also reported exertional dyspnea without frank chest pain.  She indicated her father had an MI and stroke in his 31s.  PCP obtained a CRP in 02/2023 that is elevated at 40 with a normal WESR at that time.  She was evaluated by rheumatology with no significant abnormalities being found.  EKG showed sinus rhythm with PACs and right bundle branch block.  Renin aldosterone ratio normal.  Calcium score in 09/2023 of 47.3 (LCx) which was the 96th percentile.  Echo on 10/16/2023 showed an EF of 60 to 65%, no regional wall motion abnormalities, normal LV diastolic function parameters, normal RV systolic function and ventricular cavity size, and an estimated right atrial pressure of 3 mmHg.  She comes in doing well from a cardiac perspective and is without symptoms of angina or cardiac decompensation.  With improvement in blood pressure, her breathing has improved as well.  No dizziness, presyncope, or syncope.  She does notice some neural pedal and ankle swelling that is more progressive throughout the day and improved first  thing in the morning.  Not currently wearing compression socks.  She is scheduled for home sleep study next week.  Feels well from a cardiac perspective.   Labs independently reviewed: 02/2023 - Hgb 16.0, PLT 305, potassium 4.5, BUN 14, serum creatinine 0.92, albumin 3.7, AST/ALT normal 11/2021 - A1c 5.6, TSH normal 09/2021 - TC 207, TG 135, HDL 47, LDL 136  Past Medical History:  Diagnosis Date   Anxiety    Colon polyps    Diabetes type 2, controlled (HCC)    Edema    GERD (gastroesophageal reflux disease)    Hyperlipidemia    Hypertension    Menorrhagia    Obesity     Past Surgical History:  Procedure Laterality Date   CESAREAN SECTION  1995   CHOLECYSTECTOMY  2004   COLONOSCOPY WITH PROPOFOL N/A 11/10/2020   repeat in 3 yrs; Procedure: COLONOSCOPY WITH PROPOFOL;  Surgeon: [name], MD;  Location: [facility] ENDOSCOPY;  Service: Gastroenterology;  Laterality: N/A;   ESOPHAGOGASTRODUODENOSCOPY (EGD) WITH PROPOFOL N/A 11/10/2020   Procedure: ESOPHAGOGASTRODUODENOSCOPY (EGD) WITH PROPOFOL;  Surgeon: [name], MD;  Location: [facility] ENDOSCOPY;  Service: Gastroenterology;  Laterality: N/A;   IUD placement  12/07/2011   LAPAROSCOPIC GASTRIC BAND REMOVAL WITH LAPAROSCOPIC GASTRIC SLEEVE RESECTION  08/2016   TOTAL HIP ARTHROPLASTY  2011, 2015   UPPER GI ENDOSCOPY     weightloss      Current Medications: Current Meds  Medication Sig   ALPRAZolam (XANAX) 0.25  MG tablet Take 0.25 mg by mouth 2 (two) times daily as needed.   amLODipine (NORVASC) 5 MG tablet Take 10 mg by mouth daily.   Azelastine-Fluticasone 137-50 MCG/ACT SUSP Place 1 spray into both nostrils 2 (two) times daily.   buPROPion (WELLBUTRIN XL) 300 MG 24 hr tablet Take 300 mg by mouth daily.   levocetirizine (XYZAL) 5 MG tablet Take 5 mg by mouth every evening.   levonorgestrel (MIRENA, 52 MG,) 20 MCG/24HR IUD 1 Intra Uterine Device (1 each total) by Intrauterine route once.   levothyroxine (SYNTHROID) 50 MCG tablet Take  50 mcg by mouth daily before breakfast.   losartan (COZAAR) 100 MG tablet Take 100 mg by mouth daily.   MOUNJARO 2.5 MG/0.5ML Pen Inject 2.5 mg into the skin once a week.   pantoprazole (PROTONIX) 40 MG tablet Take 40 mg QD to BID   rosuvastatin (CRESTOR) 10 MG tablet Take 1 tablet (10 mg total) by mouth daily.   Vitamin D-Vitamin K (D3 + K2 PO)     Allergies:   Morphine   Social History   Socioeconomic History   Marital status: Married    Spouse name: Not on file   Number of children: Not on file   Years of education: Not on file   Highest education level: Not on file  Occupational History   Not on file  Tobacco Use   Smoking status: Never   Smokeless tobacco: Never  Vaping Use   Vaping status: Never Used  Substance and Sexual Activity   Alcohol use: Yes   Drug use: No   Sexual activity: Yes    Partners: Male    Birth control/protection: I.U.D., Surgical    Comment: Mirena/Vasectomy  Other Topics Concern   Not on file  Social History Narrative   Not on file   Social Drivers of Health   Financial Resource Strain: Not on file  Food Insecurity: Not on file  Transportation Needs: Not on file  Physical Activity: Insufficiently Active (12/18/2017)   Exercise Vital Sign    Days of Exercise per Week: 1 day    Minutes of Exercise per Session: 30 min  Stress: Stress Concern Present (12/18/2017)   [employer] of Occupational Health - Occupational Stress Questionnaire    Feeling of Stress : Rather much  Social Connections: Moderately Integrated (12/18/2017)   Social Connection and Isolation Panel [NHANES]    Frequency of Communication with Friends and Family: More than three times a week    Frequency of Social Gatherings with Friends and Family: Twice a week    Attends Religious Services: More than 4 times per year    Active Member of [employer] or Organizations: No    Attends Banker Meetings: Never    Marital Status: Married     Family History:  The  patient's family history includes Heart disease in her father; Hypertension in her father; Stroke in her father. There is no history of Breast cancer.  ROS:   12-point review of systems is negative unless otherwise noted in the HPI.   EKGs/Labs/Other Studies Reviewed:    Studies reviewed were summarized above. The additional studies were reviewed today:  2D echo 10/16/2023: 1. Left ventricular ejection fraction, by estimation, is 60 to 65%. The  left ventricle has normal function. The left ventricle has no regional  wall motion abnormalities. Left ventricular diastolic parameters were  normal. The average left ventricular  global longitudinal strain is -19.1 %.   2. Right ventricular systolic  function is normal. The right ventricular  size is normal. Tricuspid regurgitation signal is inadequate for assessing  PA pressure.   3. The mitral valve is normal in structure. No evidence of mitral valve  regurgitation. No evidence of mitral stenosis.   4. The aortic valve has an indeterminant number of cusps. Aortic valve  regurgitation is not visualized. No aortic stenosis is present.   5. The inferior vena cava is normal in size with greater than 50%  respiratory variability, suggesting right atrial pressure of 3 mmHg.  __________  Calcium score 09/27/2023: Ascending Aorta: Normal size   Pericardium: Normal   Coronary arteries: Normal origin of left and right coronary arteries. Distribution of arterial calcifications if present, as noted below;   LM 0   LAD 0   LCx 47.3   RCA 0   Total 47.3   IMPRESSION AND RECOMMENDATION: 1. Coronary calcium score of 47.3. This was 96th percentile for age and sex matched control. 2. CAC 1-99 in LCx. CAC-DRS A1/N1. 3. Consider statin due to percentile >75. 4. Continue heart healthy lifestyle and risk factor modification.    EKG:  EKG is not ordered today.    Recent Labs: No results found for requested labs within last 365 days.   Recent Lipid Panel    Component Value Date/Time   CHOL 207 (H) 09/29/2021 0827   TRIG 135 09/29/2021 0827   HDL 47 09/29/2021 0827   CHOLHDL 4.4 09/29/2021 0827   LDLCALC 136 (H) 09/29/2021 0827    PHYSICAL EXAM:    VS:  BP 128/82 (BP Location: Left Arm, Patient Position: Sitting)   Pulse (!) 102   Ht 5\' 3"  (1.6 m)   Wt 216 lb 3.2 oz (98.1 kg)   SpO2 93%   BMI 38.30 kg/m   BMI: Body mass index is 38.3 kg/m.  Physical Exam Vitals reviewed.  Constitutional:      Appearance: She is well-developed.  HENT:     Head: Normocephalic and atraumatic.  Eyes:     General:        Right eye: No discharge.        Left eye: No discharge.  Cardiovascular:     Rate and Rhythm: Normal rate and regular rhythm.     Heart sounds: Normal heart sounds, S1 normal and S2 normal. Heart sounds not distant. No midsystolic click and no opening snap. No murmur heard.    No friction rub.  Pulmonary:     Effort: Pulmonary effort is normal. No respiratory distress.     Breath sounds: Normal breath sounds. No decreased breath sounds, wheezing, rhonchi or rales.  Chest:     Chest wall: No tenderness.  Abdominal:     General: There is no distension.  Musculoskeletal:     Cervical back: Normal range of motion.     Comments: Trivial bilateral ankle swelling.  Skin:    General: Skin is warm and dry.     Nails: There is no clubbing.  Neurological:     Mental Status: She is alert and oriented to person, place, and time.  Psychiatric:        Speech: Speech normal.        Behavior: Behavior normal.        Thought Content: Thought content normal.        Judgment: Judgment normal.     Wt Readings from Last 3 Encounters:  11/01/23 216 lb 3.2 oz (98.1 kg)  10/29/23 219 lb (99.3 kg)  10/03/23  218 lb (98.9 kg)     ASSESSMENT & PLAN:   CAD with HLD and family history of premature CAD: No symptoms suggest of of angina or cardiac decompensation.  Continue aggressive risk factor modification and  primary prevention including recently started rosuvastatin.  Plan for follow-up lipid and liver function at next visit.  HTN: Blood pressure has been well-controlled in recent office visits and is well-controlled in the office today on amlodipine 10 mg and losartan 100 mg.  Low-sodium diet recommended.  Lower extremity swelling: Suspect her lower extremity swelling is in the setting of venous insufficiency/dependent edema and exacerbated by amlodipine use.  Recommend leg elevation and compression socks.  Consider tapering down/off amlodipine.  Sleep disordered breathing: Scheduled for sleep study next week.      Disposition: F/u with Dr. Kirke Corin or an APP in 2 months.   Medication Adjustments/Labs and Tests Ordered: Current medicines are reviewed at length with the patient today.  Concerns regarding medicines are outlined above. Medication changes, Labs and Tests ordered today are summarized above and listed in the Patient Instructions accessible in Encounters.   Signed, Eula Listen, PA-C 11/01/2023 4:41 PM     [facility] [place] [address] [place], [place] [address] [phone]

## 2023-11-01 ENCOUNTER — Ambulatory Visit: Payer: BC Managed Care – PPO | Attending: Physician Assistant | Admitting: Physician Assistant

## 2023-11-01 ENCOUNTER — Encounter: Payer: Self-pay | Admitting: Physician Assistant

## 2023-11-01 VITALS — BP 128/82 | HR 102 | Ht 63.0 in | Wt 216.2 lb

## 2023-11-01 DIAGNOSIS — M7989 Other specified soft tissue disorders: Secondary | ICD-10-CM

## 2023-11-01 DIAGNOSIS — Z8249 Family history of ischemic heart disease and other diseases of the circulatory system: Secondary | ICD-10-CM

## 2023-11-01 DIAGNOSIS — E785 Hyperlipidemia, unspecified: Secondary | ICD-10-CM | POA: Diagnosis not present

## 2023-11-01 DIAGNOSIS — I10 Essential (primary) hypertension: Secondary | ICD-10-CM

## 2023-11-01 DIAGNOSIS — I2510 Atherosclerotic heart disease of native coronary artery without angina pectoris: Secondary | ICD-10-CM

## 2023-11-01 DIAGNOSIS — G4730 Sleep apnea, unspecified: Secondary | ICD-10-CM

## 2023-11-01 NOTE — Patient Instructions (Signed)
Medication Instructions:  Your Physician recommend you continue on your current medication as directed.    *If you need a refill on your cardiac medications before your next appointment, please call your pharmacy*   Lab Work: None ordered at this time    Follow-Up: At [facility], you and your health needs are our priority.  As part of our continuing mission to provide you with exceptional heart care, we have created designated Provider Care Teams.  These Care Teams include your primary Cardiologist (physician) and Advanced Practice Providers (APPs -  Physician Assistants and Nurse Practitioners) who all work together to provide you with the care you need, when you need it.  We recommend signing up for the patient portal called "MyChart".  Sign up information is provided on this After Visit Summary.  MyChart is used to connect with patients for Virtual Visits (Telemedicine).  Patients are able to view lab/test results, encounter notes, upcoming appointments, etc.  Non-urgent messages can be sent to your provider as well.   To learn more about what you can do with MyChart, go to [email or web address].    Your next appointment:   2 month(s)  Provider:   You may see Lorine Bears, MD or one of the following Advanced Practice Providers on your designated Care Team:   Eula Listen, [place]

## 2023-11-05 ENCOUNTER — Ambulatory Visit: Payer: BC Managed Care – PPO | Admitting: Neurology

## 2023-11-05 DIAGNOSIS — J302 Other seasonal allergic rhinitis: Secondary | ICD-10-CM

## 2023-11-05 DIAGNOSIS — G4733 Obstructive sleep apnea (adult) (pediatric): Secondary | ICD-10-CM

## 2023-11-05 DIAGNOSIS — R0683 Snoring: Secondary | ICD-10-CM

## 2023-11-05 DIAGNOSIS — R5382 Chronic fatigue, unspecified: Secondary | ICD-10-CM

## 2023-11-05 DIAGNOSIS — Z9884 Bariatric surgery status: Secondary | ICD-10-CM

## 2023-11-05 DIAGNOSIS — Z9189 Other specified personal risk factors, not elsewhere classified: Secondary | ICD-10-CM

## 2023-11-05 DIAGNOSIS — Z6838 Body mass index (BMI) 38.0-38.9, adult: Secondary | ICD-10-CM

## 2023-11-25 ENCOUNTER — Other Ambulatory Visit: Payer: Self-pay | Admitting: Neurology

## 2023-11-25 ENCOUNTER — Encounter: Payer: Self-pay | Admitting: Neurology

## 2023-11-25 DIAGNOSIS — Z6838 Body mass index (BMI) 38.0-38.9, adult: Secondary | ICD-10-CM

## 2023-11-25 DIAGNOSIS — R5382 Chronic fatigue, unspecified: Secondary | ICD-10-CM

## 2023-11-25 DIAGNOSIS — G4733 Obstructive sleep apnea (adult) (pediatric): Secondary | ICD-10-CM | POA: Insufficient documentation

## 2023-11-25 DIAGNOSIS — R0683 Snoring: Secondary | ICD-10-CM

## 2023-11-25 DIAGNOSIS — Z9189 Other specified personal risk factors, not elsewhere classified: Secondary | ICD-10-CM

## 2023-12-09 ENCOUNTER — Ambulatory Visit: Payer: BC Managed Care – PPO | Admitting: Gastroenterology

## 2023-12-09 ENCOUNTER — Other Ambulatory Visit: Payer: Self-pay

## 2023-12-09 ENCOUNTER — Telehealth: Payer: Self-pay

## 2023-12-09 DIAGNOSIS — Z860100 Personal history of colon polyps, unspecified: Secondary | ICD-10-CM

## 2023-12-09 MED ORDER — SUTAB 1479-225-188 MG PO TABS: ORAL_TABLET | ORAL | 0 refills | Status: AC

## 2023-12-09 NOTE — Telephone Encounter (Signed)
Instruction fax to [phone]. Per pt didn't receive instruction on MyChart.

## 2023-12-13 ENCOUNTER — Ambulatory Visit: Payer: BC Managed Care – PPO | Admitting: Certified Registered"

## 2023-12-13 ENCOUNTER — Encounter
Admission: RE | Disposition: A | Discharge status: Home / Self Care | Payer: Self-pay | Source: Home / Self Care | Attending: Gastroenterology

## 2023-12-13 ENCOUNTER — Encounter: Payer: Self-pay | Admitting: Gastroenterology

## 2023-12-13 ENCOUNTER — Ambulatory Visit
Admission: RE | Admit: 2023-12-13 | Discharge: 2023-12-13 | Disposition: A | Discharge status: Home / Self Care | Payer: BC Managed Care – PPO | Attending: Gastroenterology | Admitting: Gastroenterology

## 2023-12-13 DIAGNOSIS — Z860100 Personal history of colon polyps, unspecified: Secondary | ICD-10-CM

## 2023-12-13 DIAGNOSIS — E119 Type 2 diabetes mellitus without complications: Secondary | ICD-10-CM | POA: Diagnosis not present

## 2023-12-13 DIAGNOSIS — E66813 Obesity, class 3: Secondary | ICD-10-CM | POA: Diagnosis not present

## 2023-12-13 DIAGNOSIS — K219 Gastro-esophageal reflux disease without esophagitis: Secondary | ICD-10-CM | POA: Diagnosis not present

## 2023-12-13 DIAGNOSIS — D126 Benign neoplasm of colon, unspecified: Secondary | ICD-10-CM

## 2023-12-13 DIAGNOSIS — F419 Anxiety disorder, unspecified: Secondary | ICD-10-CM | POA: Insufficient documentation

## 2023-12-13 DIAGNOSIS — Z6837 Body mass index (BMI) 37.0-37.9, adult: Secondary | ICD-10-CM | POA: Diagnosis not present

## 2023-12-13 DIAGNOSIS — D122 Benign neoplasm of ascending colon: Secondary | ICD-10-CM | POA: Diagnosis not present

## 2023-12-13 DIAGNOSIS — D120 Benign neoplasm of cecum: Secondary | ICD-10-CM | POA: Diagnosis not present

## 2023-12-13 DIAGNOSIS — I10 Essential (primary) hypertension: Secondary | ICD-10-CM | POA: Diagnosis not present

## 2023-12-13 DIAGNOSIS — Z79899 Other long term (current) drug therapy: Secondary | ICD-10-CM | POA: Diagnosis not present

## 2023-12-13 DIAGNOSIS — Z1211 Encounter for screening for malignant neoplasm of colon: Secondary | ICD-10-CM | POA: Diagnosis not present

## 2023-12-13 DIAGNOSIS — Z7985 Long-term (current) use of injectable non-insulin antidiabetic drugs: Secondary | ICD-10-CM | POA: Insufficient documentation

## 2023-12-13 DIAGNOSIS — G4730 Sleep apnea, unspecified: Secondary | ICD-10-CM | POA: Diagnosis not present

## 2023-12-13 DIAGNOSIS — D124 Benign neoplasm of descending colon: Secondary | ICD-10-CM | POA: Insufficient documentation

## 2023-12-13 DIAGNOSIS — Z860101 Personal history of adenomatous and serrated colon polyps: Secondary | ICD-10-CM | POA: Diagnosis present

## 2023-12-13 HISTORY — PX: COLONOSCOPY WITH PROPOFOL: SHX5780

## 2023-12-13 HISTORY — PX: POLYPECTOMY: SHX5525

## 2023-12-13 SURGERY — COLONOSCOPY WITH PROPOFOL
Anesthesia: General

## 2023-12-13 MED ORDER — PROPOFOL 500 MG/50ML IV EMUL
INTRAVENOUS | Status: DC | PRN
  Administered 2023-12-13: 150 ug/kg/min via INTRAVENOUS

## 2023-12-13 MED ORDER — LIDOCAINE HCL (CARDIAC) PF 100 MG/5ML IV SOSY
PREFILLED_SYRINGE | INTRAVENOUS | Status: DC | PRN
  Administered 2023-12-13: 50 mg via INTRAVENOUS

## 2023-12-13 MED ORDER — PROPOFOL 10 MG/ML IV BOLUS
INTRAVENOUS | Status: DC | PRN
  Administered 2023-12-13: 80 mg via INTRAVENOUS

## 2023-12-13 MED ORDER — SODIUM CHLORIDE 0.9 % IV SOLN
INTRAVENOUS | Status: DC
  Administered 2023-12-13: 500 mL via INTRAVENOUS

## 2023-12-13 MED ORDER — PROPOFOL 1000 MG/100ML IV EMUL
INTRAVENOUS | Status: AC
  Filled 2023-12-13: qty 100

## 2023-12-13 NOTE — Op Note (Signed)
[facility] Patient Name: Jade Medina Procedure Date: 12/13/2023 8:49 AM MRN: [id number] Account #: [id number] Date of Birth: 1971-10-29 Admit Type: Outpatient Age: 53 Room: [facility] ENDO ROOM 2 Gender: Female Note Status: Finalized Instrument Name: Prentice Docker [id number] Procedure:             Colonoscopy Indications:           Surveillance: Personal history of adenomatous polyps                         on last colonoscopy > 3 years ago, Last colonoscopy:                         December 2021 Providers:             [name] MD, MD Referring MD:          Eula Fried. Flinchum (Referring MD) Medicines:             Monitored Anesthesia Care Complications:         No immediate complications. Procedure:             Pre-Anesthesia Assessment:                        - Prior to the procedure, a History and Physical was                         performed, and patient medications, allergies and                         sensitivities were reviewed. The patient's tolerance                         of previous anesthesia was reviewed.                        - The risks and benefits of the procedure and the                         sedation options and risks were discussed with the                         patient. All questions were answered and informed                         consent was obtained.                        - ASA Grade Assessment: II - A patient with mild                         systemic disease.                        After obtaining informed consent, the colonoscope was                         passed under direct vision. Throughout the procedure,                         the patient's blood  pressure, pulse, and oxygen                         saturations were monitored continuously. The                         Colonoscope was introduced through the anus and                         advanced to the the cecum, identified by the                          appendiceal orifice. The colonoscopy was performed                         with ease. The patient tolerated the procedure well.                         The quality of the bowel preparation was excellent.                         The ileocecal valve, appendiceal orifice, and rectum                         were photographed. Findings:      The perianal and digital rectal examinations were normal.      A 3 mm polyp was found in the cecum. The polyp was sessile. The polyp       was removed with a cold biopsy forceps. Resection and retrieval were       complete.      Five sessile polyps were found in the ascending colon. The polyps were 5       to 7 mm in size. These polyps were removed with a cold snare. Resection       and retrieval were complete.      Two sessile polyps were found in the descending colon. The polyps were 5       to 6 mm in size. These polyps were removed with a cold snare. Resection       and retrieval were complete.      The exam was otherwise without abnormality on direct and retroflexion       views. Impression:            - One 3 mm polyp in the cecum, removed with a cold                         biopsy forceps. Resected and retrieved.                        - Five 5 to 7 mm polyps in the ascending colon,                         removed with a cold snare. Resected and retrieved.                        - Two 5 to 6 mm polyps in the descending colon,  removed with a cold snare. Resected and retrieved.                        - The examination was otherwise normal on direct and                         retroflexion views. Recommendation:        - Discharge patient to home (with escort).                        - Resume previous diet.                        - Continue present medications.                        - Await pathology results.                        - Repeat colonoscopy in 3 years for surveillance. Procedure Code(s):     --- Professional  ---                        [phone], Colonoscopy, flexible; with removal of                         tumor(s), polyp(s), or other lesion(s) by snare                         technique                        45380, 59, Colonoscopy, flexible; with biopsy, single                         or multiple Diagnosis Code(s):     --- Professional ---                        Z86.010, Personal history of colonic polyps                        D12.0, Benign neoplasm of cecum                        D12.2, Benign neoplasm of ascending colon                        D12.4, Benign neoplasm of descending colon CPT copyright 2022 American Medical Association. All rights reserved. The codes documented in this report are preliminary and upon coder review may  be revised to meet current compliance requirements. [name], MD [name] MD, MD 12/13/2023 9:24:19 AM This report has been signed electronically. Number of Addenda: 0 Note Initiated On: 12/13/2023 8:49 AM Scope Withdrawal Time: 0 hours 11 minutes 46 seconds  Total Procedure Duration: 0 hours 14 minutes 51 seconds  Estimated Blood Loss:  Estimated blood loss: none.      [facility]

## 2023-12-13 NOTE — Anesthesia Preprocedure Evaluation (Signed)
Anesthesia Evaluation  Patient identified by MRN, date of birth, ID band Patient awake    Reviewed: Allergy & Precautions, NPO status , Patient's Chart, lab work & pertinent test results  Airway Mallampati: II  TM Distance: >3 FB Neck ROM: Full    Dental  (+) Teeth Intact   Pulmonary neg pulmonary ROS, sleep apnea    Pulmonary exam normal breath sounds clear to auscultation       Cardiovascular Exercise Tolerance: Good hypertension, Pt. on medications negative cardio ROS Normal cardiovascular exam Rhythm:Regular Rate:Normal     Neuro/Psych   Anxiety     negative neurological ROS  negative psych ROS   GI/Hepatic negative GI ROS, Neg liver ROS,GERD  Medicated,,  Endo/Other  negative endocrine ROSdiabetes, Type 2, Oral Hypoglycemic Agents  Class 3 obesity  Renal/GU negative Renal ROS  negative genitourinary   Musculoskeletal   Abdominal  (+) + obese  Peds negative pediatric ROS (+)  Hematology negative hematology ROS (+)   Anesthesia Other Findings Past Medical History: No date: Anxiety No date: Colon polyps No date: Diabetes type 2, controlled (HCC) No date: Edema No date: GERD (gastroesophageal reflux disease) No date: Hyperlipidemia No date: Hypertension No date: Menorrhagia No date: Obesity  Past Surgical History: 1995: CESAREAN SECTION 2004: CHOLECYSTECTOMY 11/10/2020: COLONOSCOPY WITH PROPOFOL; N/A     Comment:  repeat in 3 yrs; Procedure: COLONOSCOPY WITH PROPOFOL;                Surgeon: [name], MD;  Location: [facility] ENDOSCOPY;                Service: Gastroenterology;  Laterality: N/A; 11/10/2020: ESOPHAGOGASTRODUODENOSCOPY (EGD) WITH PROPOFOL; N/A     Comment:  Procedure: ESOPHAGOGASTRODUODENOSCOPY (EGD) WITH               PROPOFOL;  Surgeon: [name], MD;  Location: [facility]               ENDOSCOPY;  Service: Gastroenterology;  Laterality: N/A; 12/07/2011: IUD placement 08/2016: LAPAROSCOPIC  GASTRIC BAND REMOVAL WITH LAPAROSCOPIC GASTRIC  SLEEVE RESECTION 2011, 2015: TOTAL HIP ARTHROPLASTY No date: UPPER GI ENDOSCOPY No date: weightloss  BMI    Body Mass Index: 37.45 kg/m      Reproductive/Obstetrics negative OB ROS                             Anesthesia Physical Anesthesia Plan  ASA: 3  Anesthesia Plan: General   Post-op Pain Management:    Induction: Intravenous  PONV Risk Score and Plan: Propofol infusion and TIVA  Airway Management Planned: Natural Airway and Nasal Cannula  Additional Equipment:   Intra-op Plan:   Post-operative Plan:   Informed Consent: I have reviewed the patients History and Physical, chart, labs and discussed the procedure including the risks, benefits and alternatives for the proposed anesthesia with the patient or authorized representative who has indicated his/her understanding and acceptance.     Dental Advisory Given  Plan Discussed with: CRNA and Surgeon  Anesthesia Plan Comments:        Anesthesia Quick Evaluation

## 2023-12-13 NOTE — Anesthesia Procedure Notes (Signed)
Procedure Name: MAC Date/Time: 12/13/2023 9:04 AM  Performed by: Cheral Bay, CRNAPre-anesthesia Checklist: Patient identified, Emergency Drugs available, Suction available, Patient being monitored and Timeout performed Patient Re-evaluated:Patient Re-evaluated prior to induction Oxygen Delivery Method: Nasal cannula Induction Type: IV induction Placement Confirmation: positive ETCO2 and CO2 detector

## 2023-12-13 NOTE — H&P (Signed)
[name], MD [address], [place], [place], [address] [address], [place], [place], [address] Phone: [phone]  Fax: [phone]  Primary Care Physician:  Berniece Pap, FNP   Pre-Procedure History & Physical: HPI:  Jade Medina is a 53 y.o. female is here for an colonoscopy.   Past Medical History:  Diagnosis Date   Anxiety    Colon polyps    Diabetes type 2, controlled (HCC)    Edema    GERD (gastroesophageal reflux disease)    Hyperlipidemia    Hypertension    Menorrhagia    Obesity     Past Surgical History:  Procedure Laterality Date   CESAREAN SECTION  1995   CHOLECYSTECTOMY  2004   COLONOSCOPY WITH PROPOFOL N/A 11/10/2020   repeat in 3 yrs; Procedure: COLONOSCOPY WITH PROPOFOL;  Surgeon: [name], MD;  Location: [facility] ENDOSCOPY;  Service: Gastroenterology;  Laterality: N/A;   ESOPHAGOGASTRODUODENOSCOPY (EGD) WITH PROPOFOL N/A 11/10/2020   Procedure: ESOPHAGOGASTRODUODENOSCOPY (EGD) WITH PROPOFOL;  Surgeon: [name], MD;  Location: [facility] ENDOSCOPY;  Service: Gastroenterology;  Laterality: N/A;   IUD placement  12/07/2011   LAPAROSCOPIC GASTRIC BAND REMOVAL WITH LAPAROSCOPIC GASTRIC SLEEVE RESECTION  08/2016   TOTAL HIP ARTHROPLASTY  2011, 2015   UPPER GI ENDOSCOPY     weightloss      Prior to Admission medications   Medication Sig Start Date End Date Taking? Authorizing Provider  ALPRAZolam (XANAX) 0.25 MG tablet Take 0.25 mg by mouth 2 (two) times daily as needed.    [provider]  amLODipine (NORVASC) 5 MG tablet Take 10 mg by mouth daily.    [provider]  Azelastine-Fluticasone 137-50 MCG/ACT SUSP Place 1 spray into both nostrils 2 (two) times daily. 10/12/23   [provider]  buPROPion (WELLBUTRIN XL) 300 MG 24 hr tablet Take 300 mg by mouth daily.    [provider]  levocetirizine (XYZAL) 5 MG tablet Take 5 mg by mouth every evening.    [provider]   levonorgestrel (MIRENA, 52 MG,) 20 MCG/24HR IUD 1 Intra Uterine Device (1 each total) by Intrauterine route once. 02/06/17 11/01/23  Copland, Ilona Sorrel, PA-C  levothyroxine (SYNTHROID) 50 MCG tablet Take 50 mcg by mouth daily before breakfast.    [provider]  losartan (COZAAR) 100 MG tablet Take 100 mg by mouth daily. 06/20/23   [provider]  MOUNJARO 2.5 MG/0.5ML Pen Inject 2.5 mg into the skin once a week.    [provider]  pantoprazole (PROTONIX) 40 MG tablet Take 40 mg QD to BID 09/25/22   Copland, Alicia B, PA-C  rosuvastatin (CRESTOR) 10 MG tablet Take 1 tablet (10 mg total) by mouth daily. 10/09/23 01/07/24  Iran Ouch, MD  Sodium Sulfate-Mag Sulfate-KCl (SUTAB) [phone] MG TABS At 5 PM take 12 tablets using the 8 oz cup provided in the kit drinking 5 cups of water and 5 hours before your procedure repeat the same process. 12/09/23   [name], MD  Vitamin D-Vitamin K (D3 + K2 PO)     [provider]    Allergies as of 12/09/2023 - Review Complete 11/01/2023  Allergen Reaction Noted   Morphine Nausea And Vomiting and Other (See Comments) 08/20/2016    Family History  Problem Relation Age of Onset   Heart disease Father    Stroke Father    Hypertension Father    Breast cancer Neg Hx     Social History  Socioeconomic History   Marital status: Married    Spouse name: Not on file   Number of children: Not on file   Years of education: Not on file   Highest education level: Not on file  Occupational History   Not on file  Tobacco Use   Smoking status: Never   Smokeless tobacco: Never  Vaping Use   Vaping status: Never Used  Substance and Sexual Activity   Alcohol use: Yes   Drug use: No   Sexual activity: Yes    Partners: Male    Birth control/protection: I.U.D., Surgical    Comment: Mirena/Vasectomy  Other Topics Concern   Not on file  Social History Narrative   Not on file   Social Drivers of Health    Financial Resource Strain: Not on file  Food Insecurity: Not on file  Transportation Needs: Not on file  Physical Activity: Insufficiently Active (12/18/2017)   Exercise Vital Sign    Days of Exercise per Week: 1 day    Minutes of Exercise per Session: 30 min  Stress: Stress Concern Present (12/18/2017)   [employer] of Occupational Health - Occupational Stress Questionnaire    Feeling of Stress : Rather much  Social Connections: Moderately Integrated (12/18/2017)   Social Connection and Isolation Panel [NHANES]    Frequency of Communication with Friends and Family: More than three times a week    Frequency of Social Gatherings with Friends and Family: Twice a week    Attends Religious Services: More than 4 times per year    Active Member of [employer] or Organizations: No    Attends Banker Meetings: Never    Marital Status: Married  Catering manager Violence: Not At Risk (12/18/2017)   Humiliation, Afraid, Rape, and Kick questionnaire    Fear of Current or Ex-Partner: No    Emotionally Abused: No    Physically Abused: No    Sexually Abused: No    Review of Systems: See HPI, otherwise negative ROS  Physical Exam: There were no vitals taken for this visit. General:   Alert,  pleasant and cooperative in NAD Head:  Normocephalic and atraumatic. Neck:  Supple; no masses or thyromegaly. Lungs:  Clear throughout to auscultation, normal respiratory effort.    Heart:  +S1, +S2, Regular rate and rhythm, No edema. Abdomen:  Soft, nontender and nondistended. Normal bowel sounds, without guarding, and without rebound.   Neurologic:  Alert and  oriented x4;  grossly normal neurologically.  Impression/Plan: Jade Medina is here for an colonoscopy to be performed for surveillance due to prior history of colon polyps   Risks, benefits, limitations, and alternatives regarding  colonoscopy have been reviewed with the patient.  Questions have been answered.  All parties  agreeable.   [name], MD  12/13/2023, 8:19 AM

## 2023-12-13 NOTE — Transfer of Care (Signed)
Immediate Anesthesia Transfer of Care Note  Patient: Jade Medina  Procedure(s) Performed: COLONOSCOPY WITH PROPOFOL POLYPECTOMY  Patient Location: PACU and Endoscopy Unit  Anesthesia Type:General  Level of Consciousness: awake  Airway & Oxygen Therapy: Patient Spontanous Breathing  Post-op Assessment: Report given to RN and Post -op Vital signs reviewed and stable  Post vital signs: Reviewed and stable  Last Vitals:  Vitals Value Taken Time  BP 125/84 12/13/23 0925  Temp    Pulse 95 12/13/23 0925  Resp    SpO2 98 % 12/13/23 0925    Last Pain:  Vitals:   12/13/23 0925  TempSrc:   PainSc: 0-No pain         Complications: No notable events documented.

## 2023-12-13 NOTE — Anesthesia Postprocedure Evaluation (Signed)
Anesthesia Post Note  Patient: Jade Medina  Procedure(s) Performed: COLONOSCOPY WITH PROPOFOL POLYPECTOMY  Patient location during evaluation: PACU Anesthesia Type: General Level of consciousness: awake and awake and alert Pain management: pain level controlled Vital Signs Assessment: post-procedure vital signs reviewed and stable Respiratory status: spontaneous breathing Cardiovascular status: stable Anesthetic complications: no   No notable events documented.   Last Vitals:  Vitals:   12/13/23 0925 12/13/23 0935  BP: 125/84 (!) 131/91  Pulse: 95 70  Resp:  16  Temp:    SpO2: 98% 100%    Last Pain:  Vitals:   12/13/23 0935  TempSrc:   PainSc: 0-No pain                 VAN STAVEREN,Labrina Lines

## 2023-12-16 ENCOUNTER — Encounter: Payer: Self-pay | Admitting: Gastroenterology

## 2023-12-16 LAB — SURGICAL PATHOLOGY

## 2023-12-23 ENCOUNTER — Encounter: Payer: Self-pay | Admitting: Gastroenterology

## 2023-12-27 ENCOUNTER — Other Ambulatory Visit: Payer: Self-pay

## 2023-12-27 DIAGNOSIS — Z79899 Other long term (current) drug therapy: Secondary | ICD-10-CM

## 2023-12-28 LAB — LIPID PANEL
Chol/HDL Ratio: 2.8 {ratio} (ref 0.0–4.4)
Cholesterol, Total: 145 mg/dL (ref 100–199)
HDL: 51 mg/dL (ref >39)
LDL Chol Calc (NIH): 76 mg/dL (ref 0–99)
Triglycerides: 97 mg/dL (ref 0–149)
VLDL Cholesterol Cal: 18 mg/dL (ref 5–40)

## 2023-12-28 LAB — HEPATIC FUNCTION PANEL
ALT: 12 [IU]/L (ref 0–32)
AST: 15 [IU]/L (ref 0–40)
Albumin: 4 g/dL (ref 3.8–4.9)
Alkaline Phosphatase: 59 [IU]/L (ref 44–121)
Bilirubin Total: 0.4 mg/dL (ref 0.0–1.2)
Bilirubin, Direct: 0.12 mg/dL (ref 0.00–0.40)
Total Protein: 6.7 g/dL (ref 6.0–8.5)

## 2023-12-31 ENCOUNTER — Encounter: Payer: Self-pay | Admitting: Neurology

## 2024-01-01 NOTE — Progress Notes (Unsigned)
Cardiology Office Note    Date:  01/02/2024   ID:  Jade Medina, Jade Medina [birth year], MRN [id number]  PCP:  Berniece Pap, FNP  Cardiologist:  Lorine Bears, MD  Electrophysiologist:  None   Chief Complaint: Follow-up  History of Present Illness:   Jade Medina is a 53 y.o. female with history of CAD by CT imaging, HTN, HLD, recently diagnosed sleep apnea, and family history of premature CAD who presents for follow-up of CAD.   She was evaluated by Dr. Kirke Corin as a new patient on 09/26/2023 at the request of her PCP for hypertension and elevated CRP.  She had no prior cardiac history.  She reported a 40 pound weight gain over the preceding 2 years and started having elevated BP readings approximately 6 months prior to her visit.  Renal artery duplex done in 03/2023 showed no evidence of RAS.  She reported snoring loudly and was scheduled for a sleep study.  She also reported exertional dyspnea without frank chest pain.  She indicated her father had an MI and stroke in his 33s.  PCP obtained a CRP in 02/2023 that is elevated at 40 with a normal WESR at that time.  She was evaluated by rheumatology with no significant abnormalities being found.  EKG showed sinus rhythm with PACs and right bundle branch block.  Renin aldosterone ratio normal.  Calcium score in 09/2023 of 47.3 (LCx), which was the 96th percentile.  Echo on 10/16/2023 showed an EF of 60 to 65%, no regional wall motion abnormalities, normal LV diastolic function parameters, normal RV systolic function and ventricular cavity size, and an estimated right atrial pressure of 3 mmHg.  She was seen in follow-up on 11/01/2023 and was without symptoms of angina or cardiac decompensation.  With improvement in blood pressure, her breathing had improved as well.  It was suspected her lower extremity swelling was in the setting of venous insufficiency/dependent edema that was exacerbated by amlodipine use.  Since we last saw her, she has  undergone sleep study and has been diagnosed with sleep apnea, currently awaiting arrival of CPAP.  She comes in doing well from a cardiac perspective and is without symptoms of angina or cardiac decompensation.  She indicates she will be receiving her CPAP soon.  Lower extremity swelling has improved.  No dizziness, presyncope, or syncope.  Adherent and tolerating amlodipine, losartan, and rosuvastatin.  Weight is down at least 6 pounds by our scale.  Overall feels well and does not have any acute cardiac concerns at this time.   Labs independently reviewed: 12/2023 - albumin 4.0, AST/ALT normal, TC 145, TG 97, HDL 51, LDL 76 02/2023 - Hgb 16.0, PLT 305, potassium 4.5, BUN 14, serum creatinine 0.92 11/2021 - A1c 5.6, TSH normal   Past Medical History:  Diagnosis Date   Anxiety    Colon polyps    Diabetes type 2, controlled (HCC)    Edema    GERD (gastroesophageal reflux disease)    Hyperlipidemia    Hypertension    Menorrhagia    Obesity     Past Surgical History:  Procedure Laterality Date   CESAREAN SECTION  1995   CHOLECYSTECTOMY  2004   COLONOSCOPY WITH PROPOFOL N/A 11/10/2020   repeat in 3 yrs; Procedure: COLONOSCOPY WITH PROPOFOL;  Surgeon: [name], MD;  Location: [facility] ENDOSCOPY;  Service: Gastroenterology;  Laterality: N/A;   COLONOSCOPY WITH PROPOFOL N/A 12/13/2023   Procedure: COLONOSCOPY WITH PROPOFOL;  Surgeon: [name], MD;  Location:  [facility] ENDOSCOPY;  Service: Gastroenterology;  Laterality: N/A;   ESOPHAGOGASTRODUODENOSCOPY (EGD) WITH PROPOFOL N/A 11/10/2020   Procedure: ESOPHAGOGASTRODUODENOSCOPY (EGD) WITH PROPOFOL;  Surgeon: [name], MD;  Location: [facility] ENDOSCOPY;  Service: Gastroenterology;  Laterality: N/A;   IUD placement  12/07/2011   JOINT REPLACEMENT     LAPAROSCOPIC GASTRIC BAND REMOVAL WITH LAPAROSCOPIC GASTRIC SLEEVE RESECTION  08/2016   POLYPECTOMY  12/13/2023   Procedure: POLYPECTOMY;  Surgeon: [name], MD;  Location: [facility] ENDOSCOPY;   Service: Gastroenterology;;   TOTAL HIP ARTHROPLASTY  2011, 2015   UPPER GI ENDOSCOPY     weightloss      Current Medications: Current Meds  Medication Sig   ALPRAZolam (XANAX) 0.25 MG tablet Take 0.25 mg by mouth 2 (two) times daily as needed.   amLODipine (NORVASC) 5 MG tablet Take 10 mg by mouth daily.   Azelastine-Fluticasone 137-50 MCG/ACT SUSP Place 1 spray into both nostrils 2 (two) times daily.   buPROPion (WELLBUTRIN XL) 300 MG 24 hr tablet Take 300 mg by mouth daily.   levocetirizine (XYZAL) 5 MG tablet Take 5 mg by mouth every evening.   levothyroxine (SYNTHROID) 50 MCG tablet Take 50 mcg by mouth daily before breakfast.   losartan (COZAAR) 100 MG tablet Take 100 mg by mouth daily.   MOUNJARO 2.5 MG/0.5ML Pen Inject 2.5 mg into the skin once a week.   pantoprazole (PROTONIX) 40 MG tablet Take 40 mg QD to BID   rosuvastatin (CRESTOR) 10 MG tablet Take 1 tablet (10 mg total) by mouth daily.   Sodium Sulfate-Mag Sulfate-KCl (SUTAB) [phone] MG TABS At 5 PM take 12 tablets using the 8 oz cup provided in the kit drinking 5 cups of water and 5 hours before your procedure repeat the same process.   Vitamin D-Vitamin K (D3 + K2 PO)     Allergies:   Morphine   Social History   Socioeconomic History   Marital status: Married    Spouse name: Not on file   Number of children: Not on file   Years of education: Not on file   Highest education level: Not on file  Occupational History   Not on file  Tobacco Use   Smoking status: Never   Smokeless tobacco: Never  Vaping Use   Vaping status: Never Used  Substance and Sexual Activity   Alcohol use: Yes   Drug use: No   Sexual activity: Yes    Partners: Male    Birth control/protection: I.U.D., Surgical    Comment: Mirena/Vasectomy  Other Topics Concern   Not on file  Social History Narrative   Not on file   Social Drivers of Health   Financial Resource Strain: Not on file  Food Insecurity: Not on file   Transportation Needs: Not on file  Physical Activity: Insufficiently Active (12/18/2017)   Exercise Vital Sign    Days of Exercise per Week: 1 day    Minutes of Exercise per Session: 30 min  Stress: Stress Concern Present (12/18/2017)   [employer] of Occupational Health - Occupational Stress Questionnaire    Feeling of Stress : Rather much  Social Connections: Moderately Integrated (12/18/2017)   Social Connection and Isolation Panel [NHANES]    Frequency of Communication with Friends and Family: More than three times a week    Frequency of Social Gatherings with Friends and Family: Twice a week    Attends Religious Services: More than 4 times per year    Active Member of Clubs or Organizations: No  Attends Banker Meetings: Never    Marital Status: Married     Family History:  The patient's family history includes Heart disease in her father; Hypertension in her father; Stroke in her father. There is no history of Breast cancer.  ROS:   12-point review of systems is negative unless otherwise noted in the HPI.   EKGs/Labs/Other Studies Reviewed:    Studies reviewed were summarized above. The additional studies were reviewed today:  2D echo 10/16/2023: 1. Left ventricular ejection fraction, by estimation, is 60 to 65%. The  left ventricle has normal function. The left ventricle has no regional  wall motion abnormalities. Left ventricular diastolic parameters were  normal. The average left ventricular  global longitudinal strain is -19.1 %.   2. Right ventricular systolic function is normal. The right ventricular  size is normal. Tricuspid regurgitation signal is inadequate for assessing  PA pressure.   3. The mitral valve is normal in structure. No evidence of mitral valve  regurgitation. No evidence of mitral stenosis.   4. The aortic valve has an indeterminant number of cusps. Aortic valve  regurgitation is not visualized. No aortic stenosis is present.    5. The inferior vena cava is normal in size with greater than 50%  respiratory variability, suggesting right atrial pressure of 3 mmHg.  __________   Calcium score 09/27/2023: Ascending Aorta: Normal size   Pericardium: Normal   Coronary arteries: Normal origin of left and right coronary arteries. Distribution of arterial calcifications if present, as noted below;   LM 0   LAD 0   LCx 47.3   RCA 0   Total 47.3   IMPRESSION AND RECOMMENDATION: 1. Coronary calcium score of 47.3. This was 96th percentile for age and sex matched control. 2. CAC 1-99 in LCx. CAC-DRS A1/N1. 3. Consider statin due to percentile >75. 4. Continue heart healthy lifestyle and risk factor modification.   EKG:  EKG is ordered today.  The EKG ordered today demonstrates NSR, 91 bpm, RBBB  Recent Labs: 12/27/2023: ALT 12  Recent Lipid Panel    Component Value Date/Time   CHOL 145 12/27/2023 0814   TRIG 97 12/27/2023 0814   HDL 51 12/27/2023 0814   CHOLHDL 2.8 12/27/2023 0814   LDLCALC 76 12/27/2023 0814    PHYSICAL EXAM:    VS:  BP 110/77 (BP Location: Left Arm, Patient Position: Sitting, Cuff Size: Normal)   Pulse 91   Ht 5\' 3"  (1.6 m)   Wt 210 lb 3.2 oz (95.3 kg)   LMP  (LMP Unknown) Comment: pregnancy test negative  SpO2 98%   BMI 37.24 kg/m   BMI: Body mass index is 37.24 kg/m.  Physical Exam Vitals reviewed.  Constitutional:      Appearance: She is well-developed.  HENT:     Head: Normocephalic and atraumatic.  Eyes:     General:        Right eye: No discharge.        Left eye: No discharge.  Cardiovascular:     Rate and Rhythm: Normal rate and regular rhythm.     Pulses:          Posterior tibial pulses are 2+ on the right side and 2+ on the left side.     Heart sounds: Normal heart sounds, S1 normal and S2 normal. Heart sounds not distant. No midsystolic click and no opening snap. No murmur heard.    No friction rub.  Pulmonary:     Effort: Pulmonary  effort is normal.  No respiratory distress.     Breath sounds: Normal breath sounds. No decreased breath sounds, wheezing, rhonchi or rales.  Chest:     Chest wall: No tenderness.  Musculoskeletal:     Cervical back: Normal range of motion.     Right lower leg: No edema.     Left lower leg: No edema.  Skin:    General: Skin is warm and dry.     Nails: There is no clubbing.  Neurological:     Mental Status: She is alert and oriented to person, place, and time.  Psychiatric:        Speech: Speech normal.        Behavior: Behavior normal.        Thought Content: Thought content normal.        Judgment: Judgment normal.     Wt Readings from Last 3 Encounters:  01/02/24 210 lb 3.2 oz (95.3 kg)  12/13/23 211 lb 6.7 oz (95.9 kg)  11/01/23 216 lb 3.2 oz (98.1 kg)     ASSESSMENT & PLAN:   CAD involving the native coronary arteries without angina, HLD, and family history of premature CAD: No symptoms suggestive of angina or cardiac decompensation.  LDL 76 in 12/2023.  Continue aggressive risk factor modification and primary prevention including rosuvastatin 10 mg.  No indication for further ischemic testing at this time.  HTN: Blood pressure well-controlled in the office today.  Remains on amlodipine 10 mg and losartan 100 mg.  Low-sodium diet recommended.  Management of sleep apnea as outlined below.  Lower extremity swelling: Much improved.  Continue leg elevation.  OSA: Awaiting CPAP.    Disposition: F/u with Dr. Kirke Corin or an APP in 6 months.   Medication Adjustments/Labs and Tests Ordered: Current medicines are reviewed at length with the patient today.  Concerns regarding medicines are outlined above. Medication changes, Labs and Tests ordered today are summarized above and listed in the Patient Instructions accessible in Encounters.   Signed, Eula Listen, PA-C 01/02/2024 4:51 PM     [facility] [address] [place], [place] [address] [phone]

## 2024-01-02 ENCOUNTER — Encounter: Payer: Self-pay | Admitting: Physician Assistant

## 2024-01-02 ENCOUNTER — Ambulatory Visit: Payer: BC Managed Care – PPO | Attending: Physician Assistant | Admitting: Physician Assistant

## 2024-01-02 VITALS — BP 110/77 | HR 91 | Ht 63.0 in | Wt 210.2 lb

## 2024-01-02 DIAGNOSIS — I2510 Atherosclerotic heart disease of native coronary artery without angina pectoris: Secondary | ICD-10-CM | POA: Diagnosis not present

## 2024-01-02 DIAGNOSIS — E785 Hyperlipidemia, unspecified: Secondary | ICD-10-CM

## 2024-01-02 DIAGNOSIS — I10 Essential (primary) hypertension: Secondary | ICD-10-CM

## 2024-01-02 DIAGNOSIS — M7989 Other specified soft tissue disorders: Secondary | ICD-10-CM

## 2024-01-02 DIAGNOSIS — Z8249 Family history of ischemic heart disease and other diseases of the circulatory system: Secondary | ICD-10-CM

## 2024-01-02 DIAGNOSIS — G4730 Sleep apnea, unspecified: Secondary | ICD-10-CM

## 2024-01-02 NOTE — Patient Instructions (Signed)
Medication Instructions:  No changes *If you need a refill on your cardiac medications before your next appointment, please call your pharmacy*   Lab Work: None ordered If you have labs (blood work) drawn today and your tests are completely normal, you will receive your results only by: MyChart Message (if you have MyChart) OR A paper copy in the mail If you have any lab test that is abnormal or we need to change your treatment, we will call you to review the results.   Testing/Procedures: None ordered   Follow-Up: At [facility], you and your health needs are our priority.  As part of our continuing mission to provide you with exceptional heart care, we have created designated Provider Care Teams.  These Care Teams include your primary Cardiologist (physician) and Advanced Practice Providers (APPs -  Physician Assistants and Nurse Practitioners) who all work together to provide you with the care you need, when you need it.  We recommend signing up for the patient portal called "MyChart".  Sign up information is provided on this After Visit Summary.  MyChart is used to connect with patients for Virtual Visits (Telemedicine).  Patients are able to view lab/test results, encounter notes, upcoming appointments, etc.  Non-urgent messages can be sent to your provider as well.   To learn more about what you can do with MyChart, go to [email or web address].    Your next appointment:   6 month(s)  Provider:   You may see Lorine Bears, MD or one of the following Advanced Practice Providers on your designated Care Team:   Eula Listen, [place]

## 2024-02-07 ENCOUNTER — Encounter: Payer: Self-pay | Admitting: Neurology

## 2024-02-10 ENCOUNTER — Encounter: Payer: Self-pay | Admitting: *Deleted

## 2024-02-10 NOTE — Telephone Encounter (Signed)
 Oh I see this is likely regarding an ONO.

## 2024-02-12 ENCOUNTER — Other Ambulatory Visit: Payer: Self-pay | Admitting: *Deleted

## 2024-02-12 DIAGNOSIS — Z6838 Body mass index (BMI) 38.0-38.9, adult: Secondary | ICD-10-CM

## 2024-02-12 DIAGNOSIS — G4733 Obstructive sleep apnea (adult) (pediatric): Secondary | ICD-10-CM

## 2024-02-12 DIAGNOSIS — R0683 Snoring: Secondary | ICD-10-CM

## 2024-02-12 DIAGNOSIS — R0902 Hypoxemia: Secondary | ICD-10-CM

## 2024-02-12 DIAGNOSIS — R5382 Chronic fatigue, unspecified: Secondary | ICD-10-CM

## 2024-02-13 NOTE — Telephone Encounter (Signed)
 Sue Lush, RN; Kathyrn Sheriff; Santina Evans; Jeris Penta, [place]; 1 other Received, Thank you

## 2024-02-24 NOTE — Telephone Encounter (Signed)
 I called [facility] and spoke with the [facility] location. I was told they would follow-up on this.

## 2024-02-27 ENCOUNTER — Telehealth: Payer: Self-pay | Admitting: Neurology

## 2024-02-27 NOTE — Telephone Encounter (Signed)
 Received the ONO on CPAP report that was completed through [employer] [facility] on 02/25/2024-02/26/2024. There was a total of 5 hours 17 min and 3 sec recorded. Out of that time the oxygen dropped as lo as 85% but thankfully never stayed low. There was a total of 2 min 35 sec where the oxygen level dipped below 89%. Dr Vickey Huger is out of the office. I will forward the report to Dr Frances Furbish for her to review as well but overall oxygen appears to be doing well while using the CPAP.

## 2024-02-27 NOTE — Telephone Encounter (Signed)
 I reviewed the patient's overnight pulse oximetry report, room air with PAP therapy.  Total duration of test time was 5 hours and 17 minutes, average oxygen saturation 93%, nadir was 85%, time below or at 88% saturation was 1 minute and 10 seconds.  Please advise patient that her oxygen saturations are adequate while she is on PAP therapy, she can follow-up as planned/scheduled.

## 2024-03-25 ENCOUNTER — Ambulatory Visit
Admission: RE | Admit: 2024-03-25 | Discharge: 2024-03-25 | Disposition: A | Discharge status: Home / Self Care | Source: Ambulatory Visit | Attending: Obstetrics and Gynecology | Admitting: Obstetrics and Gynecology

## 2024-03-25 DIAGNOSIS — Z1231 Encounter for screening mammogram for malignant neoplasm of breast: Secondary | ICD-10-CM | POA: Diagnosis present

## 2024-03-27 ENCOUNTER — Encounter: Payer: Self-pay | Admitting: Neurology

## 2024-03-29 ENCOUNTER — Encounter: Payer: Self-pay | Admitting: Obstetrics and Gynecology

## 2024-03-31 ENCOUNTER — Encounter: Payer: Self-pay | Admitting: Neurology

## 2024-04-07 ENCOUNTER — Telehealth (INDEPENDENT_AMBULATORY_CARE_PROVIDER_SITE_OTHER): Admitting: Neurology

## 2024-04-07 DIAGNOSIS — G4733 Obstructive sleep apnea (adult) (pediatric): Secondary | ICD-10-CM

## 2024-04-07 NOTE — Progress Notes (Signed)
 Patient: Jade Medina Date of Birth: 02/04/1971  Reason for Visit: Follow up History from: Patient Primary Neurologist: Dohmeier  Virtual Visit via Video Note  I connected with Jade Medina on 04/07/24 at  8:30 AM EDT by a video enabled telemedicine application and verified that I am speaking with the correct person using two identifiers.  Location: Patient: at her work  Provider: in the office    I discussed the limitations of evaluation and management by telemedicine and the availability of in person appointments. The patient expressed understanding and agreed to proceed.  ASSESSMENT AND PLAN 53 y.o. year old female   1.  OSA on CPAP (HST in December 2024 showed moderate to severe OSA without central apneas.  Had very low oxygen nadir and duration of total oxygen saturation under 89% and 90% during sleep study.  Set up date 01/09/2024. ONO was adequate on CPAP)  - Doing great on CPAP.  Has very good compliance!!  We will continue CPAP at current settings.  She is motivated to continue CPAP nightly for minimum 4 hours.  She had to miss a few days lately due to acid reflux, but overall since starting CPAP has had very good compliance.  She will continue to replace supplies through her DME.  We will plan to follow-up in 6 months.  She will continue to work on weight loss.  She has already lost 20 pounds at this point.  HISTORY OF PRESENT ILLNESS: Today 04/07/24 Saw Dr. Albertina Hugger November 2024 for sleep consult.  HST in December 2024 showed moderate to severe OSA without central apneas.  Had very low oxygen nadir and duration of total oxygen saturation under 89% and 90% during sleep study:  O2 Saturation (minutes) <89%:      57.2 minutes, the equivalent of over 12% of total sleep time. O2 saturation in minutes under 90% : 99 minutes = 21% of total sleep time Calculated pAHI (per hour):      28.5/h                       REM pAHI:       32/h                                           NREM pAHI:    27.5/h Dr. Albertina Hugger had recommended in-lab CPAP titration to determine if oxygen was needed.  Insurance required starting auto CPAP first. Had ONO with CPAP that showed oxygen was adequate with CPAP.   Here via VV, using FFM, mask is leaking occasionally, but mostly when she moves around on the pillow, not enough to bother her. Tolerating CPAP well, there were a few nights she didn't use due to acid reflux, vomiting. Setup date 01/09/24. Has noted very good benefit with CPAP, sleeping through the night, more restorative sleep, more daytime energy. She has lost about 20 lbs, wants to lose 15 lbs more., is taking Mounjaro, also for her DM.  Last 30-day CPAP report shows 77% compliance.  5-20 cmH2O.  AHI 3.4, leak 21. Last 30 day compliance is a little bit less due to issues with acid reflux.  However her original compliance.  In the first 30 days is superb at 100%, greater than 4 hours 93%, AHI 1.5. ESS 7.   HISTORY  Jade Medina is a 53 y.o. female patient who  is seen upon referral on 10/03/2023 from FNP Flinchum for a Sleep Medicine consult.    Chief concern according to patient :  " I have recently woken myself up from snoring and my spouse has reported that I snore- as does he".     I have the pleasure of seeing Jade Medina 10/03/23 a right-handed  married and Caucasian female with a possible sleep disorder.   Sleep relevant medical history: wore braces, wisdom teeth extraction, sinusitis, allergic congestion. Overbite and crowded teeth. Obesity. GERD.  Family medical /sleep history: father had CVA and MI at age 52  in 52- high cholesterol, high BP.  no biological family member on CPAP with OSA, insomnia, sleep walkers.    Social history: Patient is working as an Print production planner - and lives in a household with spouse,  and father -  cats and dogs. The couple has 2 grown children.  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  Tobacco  use: none.  ETOH use ; wine 3 a week,  Caffeine intake in form of Coffee( 1 cup in AM ) Soda( /) Tea ( /) , no energy drinks Exercise: none regular .   Hobbies : n/a    Sleep habits are as follows: The patient's dinner time is between 6 PM. The patient goes to bed at 8.30 PM and asleep by 9.30-10 , continues to sleep for 8 hours, wakes for 1 bathroom break, the first time at 2-3 AM.   The preferred sleep position is sideways - , with the support of 2 pillows. Dreams are reportedly frequent/vivid.  The patient wakes up with an alarm at 6.15- 6.30. Finally 6.45 AM is the usual rise time. She reports not feeling refreshed or restored in AM, independent of the sleep duration- with symptoms such as very dry mouth, morning headaches, and residual fatigue.  Naps are taken on Sundays , lasting from 1-2 hours,  and are more refreshing than nocturnal sleep.   REVIEW OF SYSTEMS: Out of a complete 14 system review of symptoms, the patient complains only of the following symptoms, and all other reviewed systems are negative.  See HPI  ALLERGIES: Allergies  Allergen Reactions   Morphine Nausea And Vomiting and Other (See Comments)    HOME MEDICATIONS: Outpatient Medications Prior to Visit  Medication Sig Dispense Refill   ALPRAZolam (XANAX) 0.25 MG tablet Take 0.25 mg by mouth 2 (two) times daily as needed.     amLODipine (NORVASC) 5 MG tablet Take 10 mg by mouth daily.     Azelastine-Fluticasone 137-50 MCG/ACT SUSP Place 1 spray into both nostrils 2 (two) times daily.     buPROPion (WELLBUTRIN XL) 300 MG 24 hr tablet Take 300 mg by mouth daily.     levocetirizine (XYZAL) 5 MG tablet Take 5 mg by mouth every evening.     levonorgestrel  (MIRENA , 52 MG,) 20 MCG/24HR IUD 1 Intra Uterine Device (1 each total) by Intrauterine route once. 1 Intra Uterine Device 0   levothyroxine (SYNTHROID) 50 MCG tablet Take 50 mcg by mouth daily before breakfast.     losartan (COZAAR) 100 MG tablet Take 100 mg by mouth  daily.     MOUNJARO 2.5 MG/0.5ML Pen Inject 2.5 mg into the skin once a week.     pantoprazole  (PROTONIX ) 40 MG tablet Take 40 mg QD to BID 120 tablet 3   rosuvastatin  (CRESTOR ) 10 MG tablet Take 1 tablet (10 mg total)  by mouth daily. 90 tablet 3   Sodium Sulfate-Mag Sulfate-KCl (SUTAB ) [phone] MG TABS At 5 PM take 12 tablets using the 8 oz cup provided in the kit drinking 5 cups of water and 5 hours before your procedure repeat the same process. 24 tablet 0   Vitamin D -Vitamin K (D3 + K2 PO)      No facility-administered medications prior to visit.    PAST MEDICAL HISTORY: Past Medical History:  Diagnosis Date   Anxiety    Colon polyps    Diabetes type 2, controlled (HCC)    Edema    GERD (gastroesophageal reflux disease)    Hyperlipidemia    Hypertension    Menorrhagia    Obesity     PAST SURGICAL HISTORY: Past Surgical History:  Procedure Laterality Date   CESAREAN SECTION  1995   CHOLECYSTECTOMY  2004   COLONOSCOPY WITH PROPOFOL  N/A 11/10/2020   repeat in 3 yrs; Procedure: COLONOSCOPY WITH PROPOFOL ;  Surgeon: Luke Salaam, MD;  Location: [facility] ENDOSCOPY;  Service: Gastroenterology;  Laterality: N/A;   COLONOSCOPY WITH PROPOFOL  N/A 12/13/2023   Procedure: COLONOSCOPY WITH PROPOFOL ;  Surgeon: Luke Salaam, MD;  Location: [facility] ENDOSCOPY;  Service: Gastroenterology;  Laterality: N/A;   ESOPHAGOGASTRODUODENOSCOPY (EGD) WITH PROPOFOL  N/A 11/10/2020   Procedure: ESOPHAGOGASTRODUODENOSCOPY (EGD) WITH PROPOFOL ;  Surgeon: Luke Salaam, MD;  Location: [facility] ENDOSCOPY;  Service: Gastroenterology;  Laterality: N/A;   IUD placement  12/07/2011   JOINT REPLACEMENT     LAPAROSCOPIC GASTRIC BAND REMOVAL WITH LAPAROSCOPIC GASTRIC SLEEVE RESECTION  08/2016   POLYPECTOMY  12/13/2023   Procedure: POLYPECTOMY;  Surgeon: Luke Salaam, MD;  Location: [facility] ENDOSCOPY;  Service: Gastroenterology;;   TOTAL HIP ARTHROPLASTY  2011, 2015   UPPER GI ENDOSCOPY     weightloss      FAMILY HISTORY: Family  History  Problem Relation Age of Onset   Heart disease Father    Stroke Father    Hypertension Father    Breast cancer Neg Hx     SOCIAL HISTORY: Social History   Socioeconomic History   Marital status: Married    Spouse name: Not on file   Number of children: Not on file   Years of education: Not on file   Highest education level: Not on file  Occupational History   Not on file  Tobacco Use   Smoking status: Never   Smokeless tobacco: Never  Vaping Use   Vaping status: Never Used  Substance and Sexual Activity   Alcohol use: Yes   Drug use: No   Sexual activity: Yes    Partners: Male    Birth control/protection: I.U.D., Surgical    Comment: Mirena /Vasectomy  Other Topics Concern   Not on file  Social History Narrative   Not on file   Social Drivers of Health   Financial Resource Strain: Not on file  Food Insecurity: Not on file  Transportation Needs: Not on file  Physical Activity: Insufficiently Active (12/18/2017)   Exercise Vital Sign    Days of Exercise per Week: 1 day    Minutes of Exercise per Session: 30 min  Stress: Stress Concern Present (12/18/2017)   [employer] of Occupational Health - Occupational Stress Questionnaire    Feeling of Stress : Rather much  Social Connections: Moderately Integrated (12/18/2017)   Social Connection and Isolation Panel [NHANES]    Frequency of Communication with Friends and Family: More than three times a week    Frequency of Social Gatherings with Friends and Family: Twice a  week    Attends Religious Services: More than 4 times per year    Active Member of Clubs or Organizations: No    Attends Banker Meetings: Never    Marital Status: Married  Catering manager Violence: Not At Risk (12/18/2017)   Humiliation, Afraid, Rape, and Kick questionnaire    Fear of Current or Ex-Partner: No    Emotionally Abused: No    Physically Abused: No    Sexually Abused: No    PHYSICAL EXAM  There were no vitals  filed for this visit. There is no height or weight on file to calculate BMI.  Generalized: Well developed, in no acute distress  Via video visit, is alert and oriented, speech is clear and concise, facial symmetry noted.  Moves about freely  DIAGNOSTIC DATA (LABS, IMAGING, TESTING) - I reviewed patient records, labs, notes, testing and imaging myself where available.  Lab Results  Component Value Date   WBC 8.6 07/16/2021   HGB 15.2 (H) 07/16/2021   HCT 43.6 07/16/2021   MCV 92.4 07/16/2021   PLT 310 07/16/2021      Component Value Date/Time   NA 138 10/16/2021 1613   NA 139 07/18/2014 1205   K 4.0 10/16/2021 1613   K 3.9 07/18/2014 1205   CL 104 10/16/2021 1613   CL 104 07/18/2014 1205   CO2 20 10/16/2021 1613   CO2 26 07/18/2014 1205   GLUCOSE 95 10/16/2021 1613   GLUCOSE 120 (H) 07/16/2021 0938   GLUCOSE 177 (H) 07/18/2014 1205   BUN 17 10/16/2021 1613   BUN 17 07/18/2014 1205   CREATININE 0.83 10/16/2021 1613   CREATININE 0.88 07/18/2014 1205   CALCIUM  8.7 10/16/2021 1613   CALCIUM  8.7 07/18/2014 1205   PROT 6.7 12/27/2023 0814   ALBUMIN 4.0 12/27/2023 0814   AST 15 12/27/2023 0814   ALT 12 12/27/2023 0814   ALKPHOS 59 12/27/2023 0814   BILITOT 0.4 12/27/2023 0814   GFRNONAA >60 07/16/2021 0938   GFRNONAA >60 07/18/2014 1205   GFRAA 117 12/19/2017 0911   GFRAA >60 07/18/2014 1205   Lab Results  Component Value Date   CHOL 145 12/27/2023   HDL 51 12/27/2023   LDLCALC 76 12/27/2023   TRIG 97 12/27/2023   CHOLHDL 2.8 12/27/2023   Lab Results  Component Value Date   HGBA1C 5.7 (H) 09/29/2021   Lab Results  Component Value Date   VITAMINB12 542 09/29/2021   Lab Results  Component Value Date   TSH 3.880 09/29/2021    Jeanmarie Millet, AGNP-C, DNP 04/07/2024, 5:30 AM [facility] [address] [place], [place] [address] [phone]

## 2024-04-07 NOTE — Patient Instructions (Signed)
 Great to meet you today!!  Continue using CPAP nightly minimum 4 hours.  Continue to replace supplies routinely through your DME.  Please feel free to reach out for any issues.  Keep up the great work!!  Follow-up in 6 months.  Thanks!!

## 2024-07-24 ENCOUNTER — Other Ambulatory Visit: Payer: Self-pay | Admitting: Cardiovascular Disease

## 2024-10-20 NOTE — Progress Notes (Deleted)
 Jade Medina

## 2024-10-21 ENCOUNTER — Telehealth: Admitting: Neurology

## 2024-10-21 ENCOUNTER — Encounter: Payer: Self-pay | Admitting: Neurology

## 2024-10-21 NOTE — Progress Notes (Deleted)
 Patient: Jade Medina Date of Birth: 03-18-1971  Reason for Visit: Follow up History from: Patient Primary Neurologist: Dohmeier  Virtual Visit via Video Note  I connected with Jade Medina on 10/21/24 at  3:00 PM EST by a video enabled telemedicine application and verified that I am speaking with the correct person using two identifiers.  Location: Patient: at her work  Provider: in the office    I discussed the limitations of evaluation and management by telemedicine and the availability of in person appointments. The patient expressed understanding and agreed to proceed.  ASSESSMENT AND PLAN 53 y.o. year old female   1.  OSA on CPAP (HST in December 2024 showed moderate to severe OSA without central apneas.  Had very low oxygen nadir and duration of total oxygen saturation under 89% and 90% during sleep study.  Set up date 01/09/2024. ONO was adequate on CPAP)  - Doing great on CPAP.  Has very good compliance!!  We will continue CPAP at current settings.  She is motivated to continue CPAP nightly for minimum 4 hours.  She had to miss a few days lately due to acid reflux, but overall since starting CPAP has had very good compliance.  She will continue to replace supplies through her DME.  We will plan to follow-up in 6 months.  She will continue to work on weight loss.  She has already lost 20 pounds at this point.  HISTORY OF PRESENT ILLNESS: Today 10/21/24   04/07/24 SS: Saw Dr. Chalice November 2024 for sleep consult.  HST in December 2024 showed moderate to severe OSA without central apneas.  Had very low oxygen nadir and duration of total oxygen saturation under 89% and 90% during sleep study:  O2 Saturation (minutes) <89%:      57.2 minutes, the equivalent of over 12% of total sleep time. O2 saturation in minutes under 90% : 99 minutes = 21% of total sleep time Calculated pAHI (per hour):      28.5/h                       REM pAHI:       32/h                                           NREM pAHI:    27.5/h Dr. Chalice had recommended in-lab CPAP titration to determine if oxygen was needed.  Insurance required starting auto CPAP first. Had ONO with CPAP that showed oxygen was adequate with CPAP.   Here via VV, using FFM, mask is leaking occasionally, but mostly when she moves around on the pillow, not enough to bother her. Tolerating CPAP well, there were a few nights she didn't use due to acid reflux, vomiting. Setup date 01/09/24. Has noted very good benefit with CPAP, sleeping through the night, more restorative sleep, more daytime energy. She has lost about 20 lbs, wants to lose 15 lbs more., is taking Mounjaro, also for her DM.  Last 30-day CPAP report shows 77% compliance.  5-20 cmH2O.  AHI 3.4, leak 21. Last 30 day compliance is a little bit less due to issues with acid reflux.  However her original compliance.  In the first 30 days is superb at 100%, greater than 4 hours 93%, AHI 1.5. ESS 7.   HISTORY  Jade Medina is a 53  y.o. female patient who is seen upon referral on 10/03/2023 from FNP Flinchum for a Sleep Medicine consult.    Chief concern according to patient :   I have recently woken myself up from snoring and my spouse has reported that I snore- as does he.     I have the pleasure of seeing Jade Medina 10/03/23 a right-handed  married and Caucasian female with a possible sleep disorder.   Sleep relevant medical history: wore braces, wisdom teeth extraction, sinusitis, allergic congestion. Overbite and crowded teeth. Obesity. GERD.  Family medical /sleep history: father had CVA and MI at age 95  in 5- high cholesterol, high BP.  no biological family member on CPAP with OSA, insomnia, sleep walkers.    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 The couple has 2 grown children.  The patient currently works in daytime - and is a caretaker to her father at  night.  Tobacco use: none.  ETOH use ; wine 3 a week,  Caffeine intake in form of Coffee( 1 cup in AM ) Soda( /) Tea ( /) , no energy drinks Exercise: none regular .   Hobbies : n/a    Sleep habits are as follows: The patient's dinner time is between 6 PM. The patient goes to bed at 8.30 PM and asleep by 9.30-10 , continues to sleep for 8 hours, wakes for 1 bathroom break, the first time at 2-3 AM.   The preferred sleep position is sideways - , with the support of 2 pillows. Dreams are reportedly frequent/vivid.  The patient wakes up with an alarm at 6.15- 6.30. Finally 6.45 AM is the usual rise time. She reports not feeling refreshed or restored in AM, independent of the sleep duration- with symptoms such as very dry mouth, morning headaches, and residual fatigue.  Naps are taken on Sundays , lasting from 1-2 hours,  and are more refreshing than nocturnal sleep.   REVIEW OF SYSTEMS: Out of a complete 14 system review of symptoms, the patient complains only of the following symptoms, and all other reviewed systems are negative.  See HPI  ALLERGIES: Allergies  Allergen Reactions   Morphine Nausea And Vomiting and Other (See Comments)    HOME MEDICATIONS: Outpatient Medications Prior to Visit  Medication Sig Dispense Refill   ALPRAZolam (XANAX) 0.25 MG tablet Take 0.25 mg by mouth 2 (two) times daily as needed.     amLODipine (NORVASC) 5 MG tablet Take 10 mg by mouth daily.     Azelastine-Fluticasone 137-50 MCG/ACT SUSP Place 1 spray into both nostrils 2 (two) times daily.     buPROPion (WELLBUTRIN XL) 300 MG 24 hr tablet Take 300 mg by mouth daily.     levocetirizine (XYZAL) 5 MG tablet Take 5 mg by mouth every evening.     levonorgestrel  (MIRENA , 52 MG,) 20 MCG/24HR IUD 1 Intra Uterine Device (1 each total) by Intrauterine route once. 1 Intra Uterine Device 0   levothyroxine (SYNTHROID) 50 MCG tablet Take 50 mcg by mouth daily before breakfast.     losartan (COZAAR) 100 MG tablet Take  100 mg by mouth daily.     MOUNJARO 2.5 MG/0.5ML Pen Inject 2.5 mg into the skin once a week.     pantoprazole  (PROTONIX ) 40 MG tablet Take 40 mg QD to BID 120 tablet 3   rosuvastatin  (CRESTOR ) 10 MG tablet TAKE 1  TABLET BY MOUTH EVERY DAY 90 tablet 1   Sodium Sulfate-Mag Sulfate-KCl (SUTAB ) [phone] MG TABS At 5 PM take 12 tablets using the 8 oz cup provided in the kit drinking 5 cups of water and 5 hours before your procedure repeat the same process. 24 tablet 0   Vitamin D -Vitamin K (D3 + K2 PO)      No facility-administered medications prior to visit.    PAST MEDICAL HISTORY: Past Medical History:  Diagnosis Date   Anxiety    Colon polyps    Diabetes type 2, controlled (HCC)    Edema    GERD (gastroesophageal reflux disease)    Hyperlipidemia    Hypertension    Menorrhagia    Obesity     PAST SURGICAL HISTORY: Past Surgical History:  Procedure Laterality Date   CESAREAN SECTION  1995   CHOLECYSTECTOMY  2004   COLONOSCOPY WITH PROPOFOL  N/A 11/10/2020   repeat in 3 yrs; Procedure: COLONOSCOPY WITH PROPOFOL ;  Surgeon: Therisa Bi, MD;  Location: [facility] ENDOSCOPY;  Service: Gastroenterology;  Laterality: N/A;   COLONOSCOPY WITH PROPOFOL  N/A 12/13/2023   Procedure: COLONOSCOPY WITH PROPOFOL ;  Surgeon: Therisa Bi, MD;  Location: [facility] ENDOSCOPY;  Service: Gastroenterology;  Laterality: N/A;   ESOPHAGOGASTRODUODENOSCOPY (EGD) WITH PROPOFOL  N/A 11/10/2020   Procedure: ESOPHAGOGASTRODUODENOSCOPY (EGD) WITH PROPOFOL ;  Surgeon: Therisa Bi, MD;  Location: [facility] ENDOSCOPY;  Service: Gastroenterology;  Laterality: N/A;   IUD placement  12/07/2011   JOINT REPLACEMENT     LAPAROSCOPIC GASTRIC BAND REMOVAL WITH LAPAROSCOPIC GASTRIC SLEEVE RESECTION  08/2016   POLYPECTOMY  12/13/2023   Procedure: POLYPECTOMY;  Surgeon: Therisa Bi, MD;  Location: [facility] ENDOSCOPY;  Service: Gastroenterology;;   TOTAL HIP ARTHROPLASTY  2011, 2015   UPPER GI ENDOSCOPY     weightloss      FAMILY  HISTORY: Family History  Problem Relation Age of Onset   Heart disease Father    Stroke Father    Hypertension Father    Breast cancer Neg Hx     SOCIAL HISTORY: Social History   Socioeconomic History   Marital status: Married    Spouse name: Not on file   Number of children: Not on file   Years of education: Not on file   Highest education level: Not on file  Occupational History   Not on file  Tobacco Use   Smoking status: Never   Smokeless tobacco: Never  Vaping Use   Vaping status: Never Used  Substance and Sexual Activity   Alcohol use: Yes   Drug use: No   Sexual activity: Yes    Partners: Male    Birth control/protection: I.U.D., Surgical    Comment: Mirena /Vasectomy  Other Topics Concern   Not on file  Social History Narrative   Not on file   Social Drivers of Health   Financial Resource Strain: Not on file  Food Insecurity: Not on file  Transportation Needs: Not on file  Physical Activity: Insufficiently Active (12/18/2017)   Exercise Vital Sign    Days of Exercise per Week: 1 day    Minutes of Exercise per Session: 30 min  Stress: Stress Concern Present (12/18/2017)   [employer] of Occupational Health - Occupational Stress Questionnaire    Feeling of Stress : Rather much  Social Connections: Moderately Integrated (12/18/2017)   Social Connection and Isolation Panel    Frequency of Communication with Friends and Family: More than three times a week    Frequency of Social Gatherings with Friends and Family: Twice  a week    Attends Religious Services: More than 4 times per year    Active Member of Clubs or Organizations: No    Attends Banker Meetings: Never    Marital Status: Married  Catering Manager Violence: Not At Risk (12/18/2017)   Humiliation, Afraid, Rape, and Kick questionnaire    Fear of Current or Ex-Partner: No    Emotionally Abused: No    Physically Abused: No    Sexually Abused: No    PHYSICAL EXAM  There were no  vitals filed for this visit. There is no height or weight on file to calculate BMI.  Generalized: Well developed, in no acute distress  Via video visit, is alert and oriented, speech is clear and concise, facial symmetry noted.  Moves about freely  DIAGNOSTIC DATA (LABS, IMAGING, TESTING) - I reviewed patient records, labs, notes, testing and imaging myself where available.  Lab Results  Component Value Date   WBC 8.6 07/16/2021   HGB 15.2 (H) 07/16/2021   HCT 43.6 07/16/2021   MCV 92.4 07/16/2021   PLT 310 07/16/2021      Component Value Date/Time   NA 138 10/16/2021 1613   NA 139 07/18/2014 1205   K 4.0 10/16/2021 1613   K 3.9 07/18/2014 1205   CL 104 10/16/2021 1613   CL 104 07/18/2014 1205   CO2 20 10/16/2021 1613   CO2 26 07/18/2014 1205   GLUCOSE 95 10/16/2021 1613   GLUCOSE 120 (H) 07/16/2021 0938   GLUCOSE 177 (H) 07/18/2014 1205   BUN 17 10/16/2021 1613   BUN 17 07/18/2014 1205   CREATININE 0.83 10/16/2021 1613   CREATININE 0.88 07/18/2014 1205   CALCIUM  8.7 10/16/2021 1613   CALCIUM  8.7 07/18/2014 1205   PROT 6.7 12/27/2023 0814   ALBUMIN 4.0 12/27/2023 0814   AST 15 12/27/2023 0814   ALT 12 12/27/2023 0814   ALKPHOS 59 12/27/2023 0814   BILITOT 0.4 12/27/2023 0814   GFRNONAA >60 07/16/2021 0938   GFRNONAA >60 07/18/2014 1205   GFRAA 117 12/19/2017 0911   GFRAA >60 07/18/2014 1205   Lab Results  Component Value Date   CHOL 145 12/27/2023   HDL 51 12/27/2023   LDLCALC 76 12/27/2023   TRIG 97 12/27/2023   CHOLHDL 2.8 12/27/2023   Lab Results  Component Value Date   HGBA1C 5.7 (H) 09/29/2021   Lab Results  Component Value Date   VITAMINB12 542 09/29/2021   Lab Results  Component Value Date   TSH 3.880 09/29/2021    Lauraine Born, AGNP-C, DNP 10/21/2024, 5:30 AM [facility] [address] [place], [place] [address] [phone]
